# Patient Record
Sex: Female | Born: 1950
Health system: Southern US, Community
[De-identification: ages and names within clinical notes are randomized; demographics above are authoritative.]

## PROBLEM LIST (undated history)

## (undated) DIAGNOSIS — M1990 Unspecified osteoarthritis, unspecified site: Secondary | ICD-10-CM

## (undated) DIAGNOSIS — Z9889 Other specified postprocedural states: Secondary | ICD-10-CM

## (undated) DIAGNOSIS — R011 Cardiac murmur, unspecified: Secondary | ICD-10-CM

## (undated) DIAGNOSIS — I10 Essential (primary) hypertension: Secondary | ICD-10-CM

## (undated) DIAGNOSIS — K219 Gastro-esophageal reflux disease without esophagitis: Secondary | ICD-10-CM

## (undated) DIAGNOSIS — R112 Nausea with vomiting, unspecified: Secondary | ICD-10-CM

## (undated) HISTORY — PX: CERVICAL FUSION: SHX112

---

## 1975-01-16 HISTORY — PX: DILATION AND CURETTAGE OF UTERUS: SHX78

## 2001-02-20 ENCOUNTER — Other Ambulatory Visit: Admission: RE | Admit: 2001-02-20 | Discharge: 2001-02-20 | Payer: Self-pay | Admitting: Obstetrics and Gynecology

## 2002-08-26 ENCOUNTER — Other Ambulatory Visit: Admission: RE | Admit: 2002-08-26 | Discharge: 2002-08-26 | Payer: Self-pay | Admitting: Obstetrics and Gynecology

## 2003-02-26 ENCOUNTER — Ambulatory Visit (HOSPITAL_COMMUNITY): Admission: RE | Admit: 2003-02-26 | Discharge: 2003-02-26 | Payer: Self-pay | Admitting: Internal Medicine

## 2003-11-05 ENCOUNTER — Other Ambulatory Visit: Admission: RE | Admit: 2003-11-05 | Discharge: 2003-11-05 | Payer: Self-pay | Admitting: Obstetrics and Gynecology

## 2004-11-16 ENCOUNTER — Other Ambulatory Visit: Admission: RE | Admit: 2004-11-16 | Discharge: 2004-11-16 | Payer: Self-pay | Admitting: Obstetrics and Gynecology

## 2004-11-23 ENCOUNTER — Ambulatory Visit (HOSPITAL_COMMUNITY): Admission: RE | Admit: 2004-11-23 | Discharge: 2004-11-23 | Payer: Self-pay | Admitting: Internal Medicine

## 2008-01-16 HISTORY — PX: JOINT REPLACEMENT: SHX530

## 2008-07-27 ENCOUNTER — Inpatient Hospital Stay (HOSPITAL_COMMUNITY): Admission: RE | Admit: 2008-07-27 | Discharge: 2008-07-30 | Payer: Self-pay | Admitting: Orthopaedic Surgery

## 2009-02-03 ENCOUNTER — Ambulatory Visit (HOSPITAL_COMMUNITY): Admission: RE | Admit: 2009-02-03 | Discharge: 2009-02-03 | Payer: Self-pay | Admitting: Orthopaedic Surgery

## 2010-04-23 LAB — COMPREHENSIVE METABOLIC PANEL
ALT: 31 U/L (ref 0–35)
AST: 22 U/L (ref 0–37)
Albumin: 4.4 g/dL (ref 3.5–5.2)
Alkaline Phosphatase: 87 U/L (ref 39–117)
BUN: 14 mg/dL (ref 6–23)
CO2: 26 mEq/L (ref 19–32)
Calcium: 9.9 mg/dL (ref 8.4–10.5)
Chloride: 107 mEq/L (ref 96–112)
Creatinine, Ser: 0.72 mg/dL (ref 0.4–1.2)
GFR calc Af Amer: >60 mL/min (ref >60)
GFR calc non Af Amer: >60 mL/min (ref >60)
Glucose, Bld: 95 mg/dL (ref 70–99)
Potassium: 4.4 mEq/L (ref 3.5–5.1)
Sodium: 140 mEq/L (ref 135–145)
Total Bilirubin: 1 mg/dL (ref 0.3–1.2)
Total Protein: 7.1 g/dL (ref 6.0–8.3)

## 2010-04-23 LAB — CROSSMATCH
ABO/RH(D): O POS
Antibody Screen: NEGATIVE

## 2010-04-23 LAB — DIFFERENTIAL
Basophils Absolute: 0 10*3/uL (ref 0.0–0.1)
Basophils Relative: 1 % (ref 0–1)
Eosinophils Absolute: 0.1 10*3/uL (ref 0.0–0.7)
Eosinophils Relative: 2 % (ref 0–5)
Lymphocytes Relative: 32 % (ref 12–46)
Lymphs Abs: 2.1 10*3/uL (ref 0.7–4.0)
Monocytes Absolute: 0.6 10*3/uL (ref 0.1–1.0)
Monocytes Relative: 9 % (ref 3–12)
Neutro Abs: 3.8 10*3/uL (ref 1.7–7.7)
Neutrophils Relative %: 57 % (ref 43–77)

## 2010-04-23 LAB — CBC
HCT: 27.3 % — ABNORMAL LOW (ref 36.0–46.0)
HCT: 22.2 % — ABNORMAL LOW (ref 36.0–46.0)
HCT: 27.4 % — ABNORMAL LOW (ref 36.0–46.0)
HCT: 36.9 % (ref 36.0–46.0)
Hemoglobin: 9.5 g/dL — ABNORMAL LOW (ref 12.0–15.0)
Hemoglobin: 12.6 g/dL (ref 12.0–15.0)
Hemoglobin: 7.7 g/dL — CL (ref 12.0–15.0)
MCHC: 34.8 g/dL (ref 30.0–36.0)
MCHC: 34.1 g/dL (ref 30.0–36.0)
MCHC: 34.6 g/dL (ref 30.0–36.0)
MCV: 88.7 fL (ref 78.0–100.0)
MCV: 88.8 fL (ref 78.0–100.0)
MCV: 92.3 fL (ref 78.0–100.0)
MCV: 92.7 fL (ref 78.0–100.0)
Platelets: 137 10*3/uL — ABNORMAL LOW (ref 150–400)
Platelets: 150 10*3/uL (ref 150–400)
Platelets: 257 10*3/uL (ref 150–400)
RBC: 3.08 MIL/uL — ABNORMAL LOW (ref 3.87–5.11)
RBC: 2.4 MIL/uL — ABNORMAL LOW (ref 3.87–5.11)
RBC: 3.08 MIL/uL — ABNORMAL LOW (ref 3.87–5.11)
RBC: 3.98 MIL/uL (ref 3.87–5.11)
RDW: 15.1 % (ref 11.5–15.5)
RDW: 13.2 % (ref 11.5–15.5)
RDW: 13.3 % (ref 11.5–15.5)
WBC: 8.5 10*3/uL (ref 4.0–10.5)
WBC: 6.7 10*3/uL (ref 4.0–10.5)
WBC: 8.3 10*3/uL (ref 4.0–10.5)
WBC: 8.4 10*3/uL (ref 4.0–10.5)

## 2010-04-23 LAB — PROTIME-INR
INR: 3.1 — ABNORMAL HIGH (ref 0.00–1.49)
INR: 0.9 (ref 0.00–1.49)
INR: 1.2 (ref 0.00–1.49)
Prothrombin Time: 34.5 seconds — ABNORMAL HIGH (ref 11.6–15.2)
Prothrombin Time: 12.4 seconds (ref 11.6–15.2)
Prothrombin Time: 15.4 seconds — ABNORMAL HIGH (ref 11.6–15.2)
Prothrombin Time: 27.3 seconds — ABNORMAL HIGH (ref 11.6–15.2)

## 2010-04-23 LAB — BASIC METABOLIC PANEL
BUN: 7 mg/dL (ref 6–23)
BUN: 10 mg/dL (ref 6–23)
BUN: 6 mg/dL (ref 6–23)
CO2: 32 mEq/L (ref 19–32)
CO2: 29 mEq/L (ref 19–32)
CO2: 32 mEq/L (ref 19–32)
Calcium: 8.1 mg/dL — ABNORMAL LOW (ref 8.4–10.5)
Calcium: 7.8 mg/dL — ABNORMAL LOW (ref 8.4–10.5)
Calcium: 7.9 mg/dL — ABNORMAL LOW (ref 8.4–10.5)
Chloride: 101 mEq/L (ref 96–112)
Chloride: 102 mEq/L (ref 96–112)
Chloride: 103 mEq/L (ref 96–112)
Creatinine, Ser: 0.63 mg/dL (ref 0.4–1.2)
Creatinine, Ser: 0.59 mg/dL (ref 0.4–1.2)
Creatinine, Ser: 0.69 mg/dL (ref 0.4–1.2)
GFR calc Af Amer: >60 mL/min (ref >60)
GFR calc Af Amer: >60 mL/min (ref >60)
GFR calc Af Amer: >60 mL/min (ref >60)
GFR calc non Af Amer: >60 mL/min (ref >60)
GFR calc non Af Amer: >60 mL/min (ref >60)
GFR calc non Af Amer: >60 mL/min (ref >60)
Glucose, Bld: 102 mg/dL — ABNORMAL HIGH (ref 70–99)
Glucose, Bld: 130 mg/dL — ABNORMAL HIGH (ref 70–99)
Glucose, Bld: 141 mg/dL — ABNORMAL HIGH (ref 70–99)
Potassium: 3.6 mEq/L (ref 3.5–5.1)
Potassium: 3.5 mEq/L (ref 3.5–5.1)
Potassium: 3.9 mEq/L (ref 3.5–5.1)
Sodium: 137 mEq/L (ref 135–145)
Sodium: 135 mEq/L (ref 135–145)
Sodium: 137 mEq/L (ref 135–145)

## 2010-04-23 LAB — URINALYSIS, ROUTINE W REFLEX MICROSCOPIC
Bilirubin Urine: NEGATIVE
Bilirubin Urine: NEGATIVE
Glucose, UA: NEGATIVE mg/dL
Glucose, UA: NEGATIVE mg/dL
Ketones, ur: NEGATIVE mg/dL
Ketones, ur: NEGATIVE mg/dL
Leukocytes, UA: NEGATIVE
Leukocytes, UA: NEGATIVE
Nitrite: NEGATIVE
Nitrite: NEGATIVE
Protein, ur: NEGATIVE mg/dL
Protein, ur: NEGATIVE mg/dL
Specific Gravity, Urine: 1.007 (ref 1.005–1.030)
Specific Gravity, Urine: 1.028 (ref 1.005–1.030)
Urobilinogen, UA: 0.2 mg/dL (ref 0.0–1.0)
Urobilinogen, UA: 1 mg/dL (ref 0.0–1.0)
pH: 5.5 (ref 5.0–8.0)
pH: 5.5 (ref 5.0–8.0)

## 2010-04-23 LAB — URINE MICROSCOPIC-ADD ON

## 2010-04-23 LAB — URINE CULTURE
Colony Count: 30000
Colony Count: NO GROWTH
Culture: NO GROWTH

## 2010-04-23 LAB — ABO/RH: ABO/RH(D): O POS

## 2010-04-23 LAB — APTT: aPTT: 29 seconds (ref 24–37)

## 2010-05-30 NOTE — Op Note (Signed)
NAME:  Nichole Brewer, Nichole Brewer           ACCOUNT NO.:  [id number]   MEDICAL RECORD NO.:  [id number]          PATIENT TYPE:  INP   LOCATION:  5018                         FACILITY:  [facility]   PHYSICIAN:  Claude Manges. Whitfield, M.D.DATE OF BIRTH:  May 02, 1950   DATE OF PROCEDURE:  07/27/2008  DATE OF DISCHARGE:                               OPERATIVE REPORT   PREOPERATIVE DIAGNOSIS:  End-stage osteoarthritis, right hip.   POSTOPERATIVE DIAGNOSIS:  End-stage osteoarthritis, right hip.   PROCEDURE:  Right total hip replacement.   SURGEON:  Claude Manges. Cleophas Dunker, MD   ASSISTANT:  Lenard Galloway. Chaney Malling, MD and Oris Drone. Petrarca, PA-C   ANESTHESIA:  General.   COMPLICATIONS:  None.   COMPONENTS:  DePuy AML 52-mm outer diameter sector 2 acetabular metallic  cup, the apex hole eliminator, the pedicle Marathon acetabular liner  Plus 4 with a 10-degree liner.  A 12-mm diameter large stature femoral  component with a 36-mm outer diameter hip ball 12-mm neck length.  I did  use two pedicle cancellus bone screws in the acetabulum, 6.5 mm in  diameter; one was 28 and the other was 25 mm.   PROCEDURE:  The patient comfortable on operating room table and under  general orotracheal anesthesia, nursing staff inserted a Foley catheter.  Urine was clear.   The patient was then placed in the lateral decubitus position with the  right side up and secured to the operating room table with the Innomed  hip system.  The right hip was then prepped with Betadine scrub and then  DuraPrep from the iliac crest to about the midcalf.  Sterile draping was  performed.   A routine southern incision was utilized and via sharp dissection was  carried down to the subcutaneous tissue.  There was abundant adipose  tissue, which made the procedure technically difficult.  There is at  least 3 inches of adipose tissue before I encountered.  The iliotibial  band was identified and incised the length of the skin incision.  Self-  retaining retractors were inserted.   With the hip internally rotated, the short external rotators were  identified.  Tendinous structures were tagged with 0 Ethibond suture and  then incised.  The capsule was identified and incised on the femoral  neck and head.  There was minimal effusion.  I could at that point  easily dislocate the hip posteriorly.  The head was devoid predominantly  of articular cartilage for the large osteophytes.  Using the calcar  guide, the head was osteotomized about a fingerbreadth above the lesser  trochanter.  The head was then delivered from the wound.   A Charnley retractor was inserted around the femoral neck.  I incised  the capsule to visualize the piriformis fossa.  Starter hole was then  made.  Reaming was performed to 11.5-mm to accept a 12-mm component.  Rasping was then performed sequentially to a 12-mm large femoral  component.   Retractor was then placed above the acetabulum.  We sized a 52-mm  acetabular component preoperatively and re-reamed a 51 with a very  shallow acetabulum, so we had to ream on  several occasions to get the  appropriate depth.  Then, we trialed a 52 components and felt that the  52 would seat proudly, but had good rim fit.  Accordingly, we impacted  the 52 mm outer diameter acetabular component the sector 2 in case we  needed to use screws.  We thought initially that we did not have good  purchase and accordingly, we re-reamed to more depths and at that point,  felt that we had good coverage.  We had also the thought that there was  some deformity of the acetabulum where it was actually retroverted with  some lack of posterior acetabular wall.  Thus, the reason for using  acetabular screws.  At that point, we impacted the acetabular component,  felt that it was stable, inserted the trial polyethylene liner, re-  inserted the 12-mm large femoral component and then trialed several neck  length using the 36-mm hip ball.  The  12-mm hip ball gave [place] perfect  stability and we thought leg lengths were at that point symmetrical.  Accordingly, the trial components were removed.  We inserted two screws  in the acetabulum superiorly and felt that we had cortex, we had bone  through the internal length of the drill hole; one measured 25 and the  other 20 mm.  We inserted the screws and had excellent purchase using  the 6.5 screws.  We then copiously irrigated throughout every step with  saline solution.  We inserted the apex hole eliminator followed by the  polyethylene Marathon liner with a 10-degree posterior lip.   We had trialed the femoral component with a Prodigy neck and  accordingly, inserted the Prodigy stem 12 mm large stature and this was  impacted within a millimeter of the femoral neck.  We then again trialed  the 36-mm hip ball with a 12-mm neck length, impacted this, we had  perfect stability throughout a full range of motion.  There was no  toggling.  I thought leg lengths remained the same as they were  preoperatively.   The hip was carefully then dislocated.  We cleaned the [facility] taper neck  and applied the final 36-mm ball with a 12-mm neck length.  The  acetabulum was inspected, was clear of any loose material.  The entire  construct was then reduced and again through a full range of motion  though we had excellent stability.  Wound was again irrigated with  saline solution.  The capsule was closed anatomically with #1 Ethibond.  The tendinous short external rotators were closed with a similar  material.  The wound was again irrigated.  The iliotibial band was  closed with a running 0 Vicryl.  The wound was again irrigated.  The  subcu was closed in multiple layers with 0 and 2-0 Vicryl and then skin  clips.  Sterile bulky dressing was applied.   The patient was awoken and carefully placed supine on the operating room  stretcher, returned to the postanesthesia recovery room.      Claude Manges.  Cleophas Dunker, M.D.  Electronically Signed     PWW/MEDQ  D:  07/27/2008  T:  07/28/2008  Job:  [id number]

## 2010-06-02 NOTE — Discharge Summary (Signed)
NAME:  Nichole, Brewer           ACCOUNT NO.:  [id number]   MEDICAL RECORD NO.:  [id number]          PATIENT TYPE:  INP   LOCATION:  5018                         FACILITY:  [facility]   PHYSICIAN:  Claude Manges. Whitfield, M.D.DATE OF BIRTH:  16-Aug-1950   DATE OF ADMISSION:  07/27/2008  DATE OF DISCHARGE:  07/30/2008                               DISCHARGE SUMMARY   ADMISSION DIAGNOSIS:  Osteoarthritis, right hip.   DISCHARGE DIAGNOSIS:  1. Osteoarthritis, right hip.  2. Posthemorrhagic anemia.   PROCEDURE:  Right total hip arthroplasty, metal-on-polyethylene.   HISTORY:  Nichole Brewer is a 60 year old white divorced female with  pain in her right hip and groin which has progressively worsened to now  moderate to severe.  It is mainly an aching pain with occasional sharp  stabbing pain.  She is not having pain with activities of daily living  as well as at night time.  She has failed conservative treatment.  She  is now indicated for total hip arthroplasty.   HOSPITAL COURSE:  A 60 year old female admitted on July 27, 2008, after  appropriate laboratory studies were obtained as well as 2 grams of Ancef  on-call to the operating room.  She was taken to the operating room  where she underwent a right total hip arthroplasty.  She tolerated the  procedure well.  She was continued on Ancef 1 g IV q.6 h. for three  doses.  Placed on Dilaudid PCA pump regular dose.  Begun on Lovenox 40  mg subcu q.24 h. until her Coumadin became therapeutic.  She is placed  into a knee immobilizer on the right when in bed.  Posterior hip  precautions were started.  Partial weightbearing, 50% of body weight.  PT and OT consults were made.  She did have post hemorrhagic anemia and  was transfused 2 units of packed cells on the 14th.  She was also weaned  off of her PCA and saline locked after her blood was completed.  She was  weaned off of her O2.  A urine for C and S, and routine UA was also  ordered on the  14th.  The remainder of her hospital course was  uneventful.  She was started on Cipro 250 mg p.o. b.i.d. on the 15th,  because it was felt she may have an urinary tract infection.  She was  discharged on the 16th and Cipro was then discontinued at home.   RADIOGRAPHIC STUDIES:  Chest x-ray of July 21, 2008 showed no active  disease, received 2 units of packed cells during her hospital course.  Preop hemoglobin 12.6, hematocrit 36.9%, white count 6700, platelets  257,000.  She dropped to 7.7 hemoglobin on the 14th, two units were  given.  The discharge hemoglobin 9.5, hematocrit 27.3%, white count  8500, platelets 137,000.  Protime preop 12.4, INR 0.9, PTT 29.  Discharge protime 34.5, INR 3.1.  Preop sodium 140, potassium 4.4,  chloride 107, CO2 of 26, glucose 95, BUN 14, creatinine 0.72.  Discharge  sodium 136, potassium 3.6, chloride 101, CO2 of 32, glucose 102, BUN 7,  creatinine 0.63.  GFR was greater  than 60 throughout her hospital  course.  Preop total protein 7.1, albumin 4.4, AST 22, ALT 31, ALP 87  and bilirubin was 1.0.  Urinalysis preop revealed a trace hemoglobin 0-2  red cells.  Urine of July 14 revealed hyaline cast and uric acid  crystals, few epithelials and 3-6 whites and 3-6 reds.  Mucus was  present.  Urine culture of July 7 revealed 30,000 colonies/mL multiple  bacterial morphotypes.  Urine culture July 14 revealed no growth.   DISCHARGE INSTRUCTIONS:  Regular diet.  Increase activity slowly.  Use  her walker, 50% weightbearing as taught in PT.  No lifting or driving  for 6 weeks.  Keep the incision clean and dry and change dressing daily  with sterile 4x4s and tape.  She will be 50% weightbearing as taught in  PT.  Follow the hip precautions.  Prescription for Percocet 5/325 one to  two tabs every 4 hours as needed for pain, Robaxin 500 mg 1 tablet every  6 hours as needed for spasms, Coumadin 5 mg as directed by Home Health  pharmacist.  Colace 100 mg twice a day for  stool softener and MiraLax as  needed for constipation.  She was not to take any Coumadin on the night  of her discharge and then start with a half a tab on the 17th.  [facility] for her home health.  She will follow back up with Dr.  Cleophas Dunker on August 09, 2008.  She was discharged in improved condition.      Oris Drone Petrarca, P.A.-C.      Claude Manges. Cleophas Dunker, M.D.  Electronically Signed    BDP/MEDQ  D:  08/10/2008  T:  08/10/2008  Job:  [id number]

## 2010-06-02 NOTE — Op Note (Signed)
NAME:  Nichole Brewer, Nichole Brewer                     ACCOUNT NO.:  [id number]   MEDICAL RECORD NO.:  [id number]                   PATIENT TYPE:  AMB   LOCATION:  DAY                                  FACILITY:  [facility]   PHYSICIAN:  R. Roetta Sessions, M.D.              DATE OF BIRTH:  [birth year]   DATE OF PROCEDURE:  02/26/2003  DATE OF DISCHARGE:                                 OPERATIVE REPORT   PROCEDURE:  Screening colonoscopy.   INDICATIONS FOR PROCEDURE:  The patient is a 60 year old lady devoid of any  lower GI tract symptoms.  There is no family history of colorectal  neoplasia.  She has never had her lower GI tract imaged.  She comes for  screening colonoscopy.  She is followed primarily by Dr. Ouida Sills.  This  approach has been discussed with the patient along with the potential risks,  benefits, and alternatives.  Her questions were answered.  She is agreeable.  Please see my handwritten H&P for more information.   PROCEDURE:  O2 saturation, blood pressure, pulses, and respirations were  monitored throughout the entirety of the procedure.  Conscious sedation was  with Versed and Demerol in divided doses.  The instrument used was the  Olympus GIF-Q140 colonoscope.   FINDINGS:  Digital rectal examination revealed no abnormalities.   ENDOSCOPIC FINDINGS:  The prep was good.   Rectum:  Examination of the rectal mucosa including retroflex view of the  anal verge revealed no abnormalities.   Colon:  The colonic mucosa was surveyed from the rectosigmoid junction  through the left, transverse, right colon to the area of the appendiceal  orifice, ileocecal valve, and cecum.  These structures were well-seen and  photographed for the record.  From this level, the scope was slowly  withdrawn.  All previously mentioned mucosal surfaces were again seen.  The  colonic mucosa appeared entirely normal.  The terminal ileum was intubated  to 5 cm.  This segment of the GI tract also appeared normal.   The patient  tolerated the procedure well and was reactive in endoscopy.   IMPRESSION:  1. Normal rectum.  2. Normal colon.  3. Normal terminal ileum.   RECOMMENDATIONS:  Repeat colonoscopy in 10 years.      ___________________________________________                                            Jonathon Bellows, M.D.   RMR/MEDQ  D:  02/26/2003  T:  02/26/2003  Job:  [id number]   cc:   Kingsley Callander. Ouida Sills, M.D.  [address]  [place]  [place] [address]  Fax: [phone]

## 2010-10-07 IMAGING — CR DG CHEST 2V
2 series · 2 of 2 positions shown · non-contrast
Comparison: None

CLINICAL DATA: Preoperative respiratory exam for orthopedic
surgery.

CHEST - 2 VIEW

[view not recorded (1 of 2)]
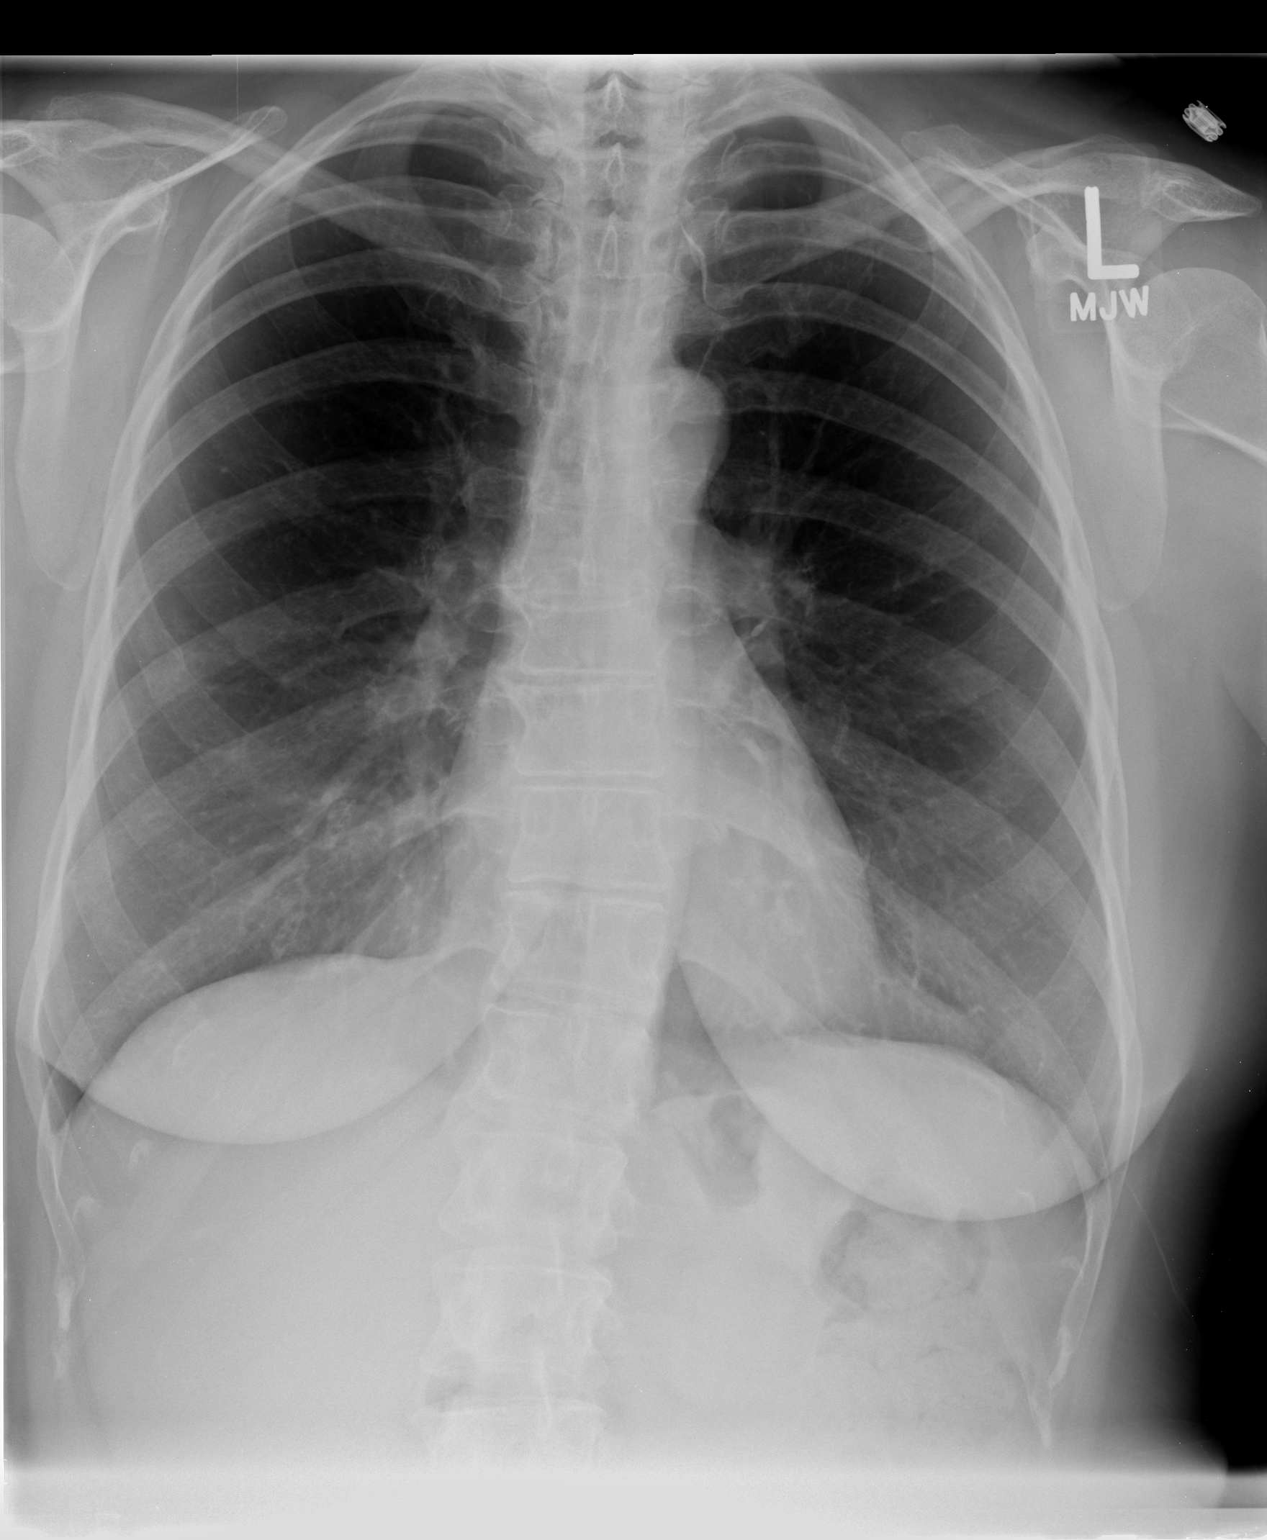

[view not recorded (2 of 2)]
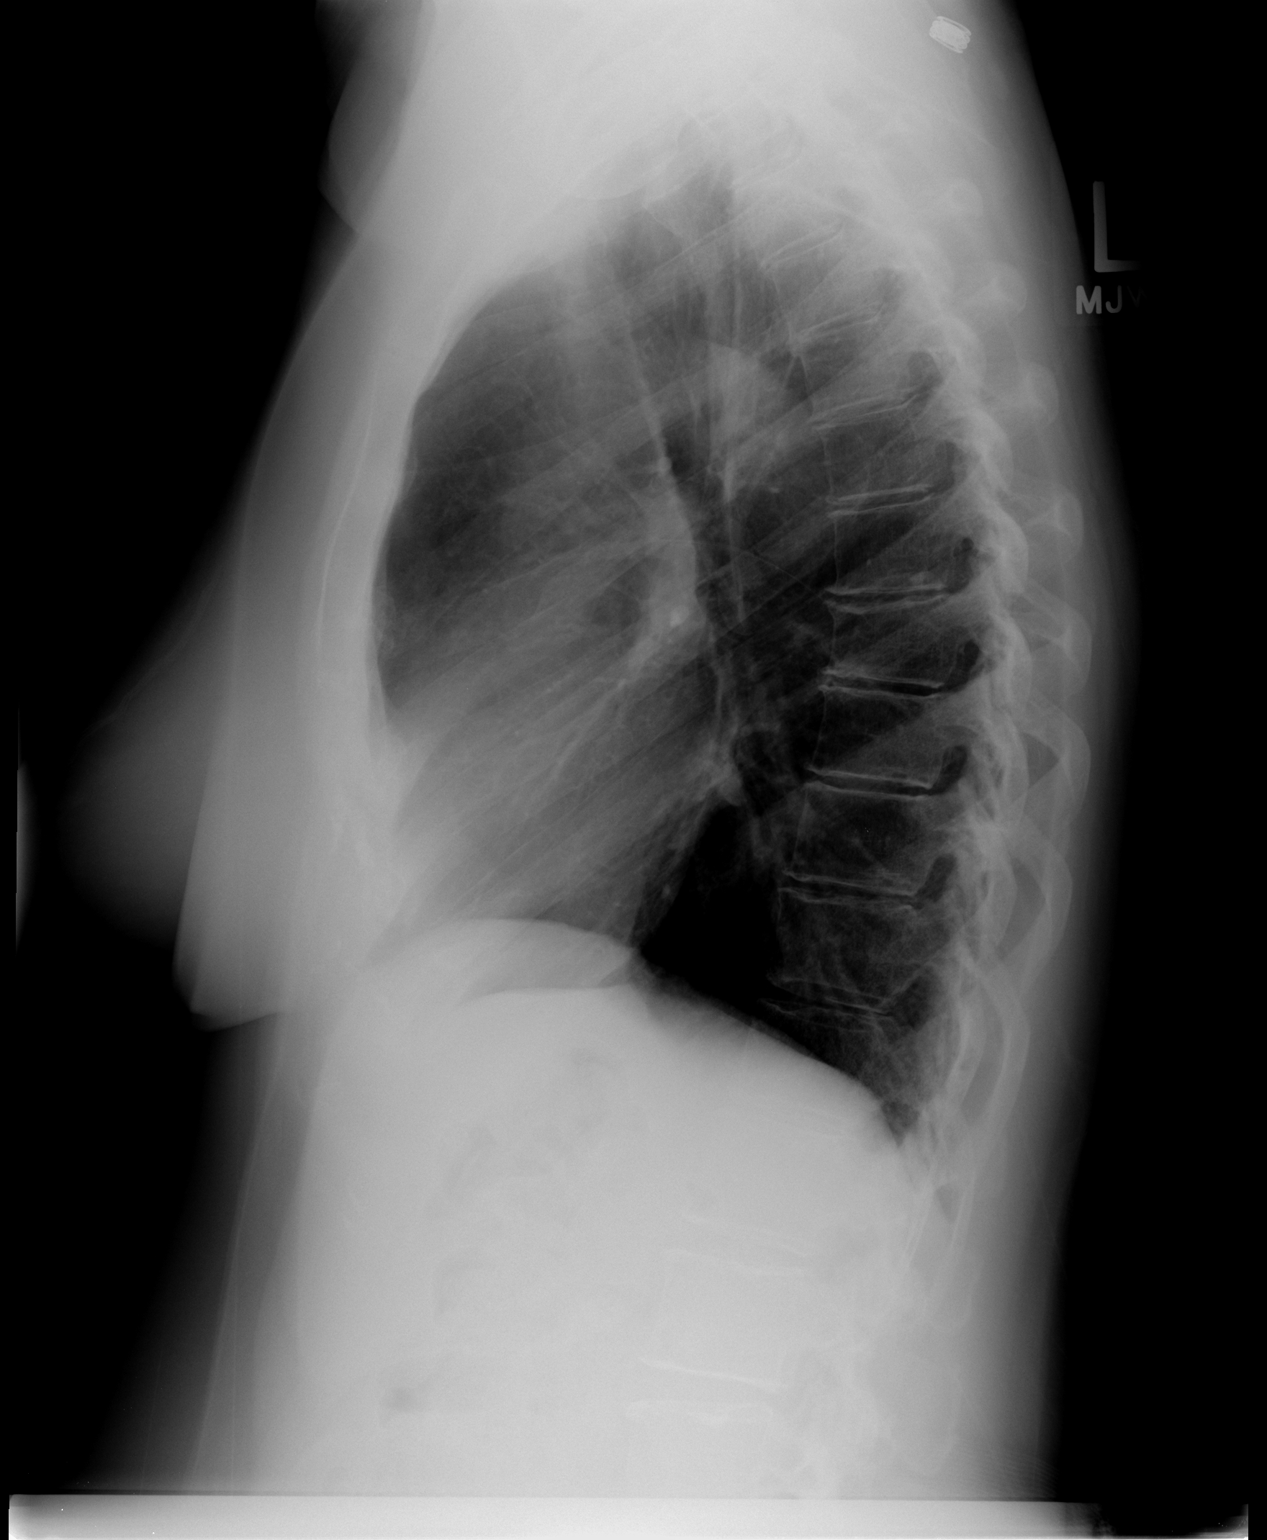

[2 of 2 positions shown; findings below may reference images not displayed]

FINDINGS: Heart size is normal.  The mediastinum is unremarkable.
Lungs are clear.  No effusions.  Ordinary degenerative change
effects the spine.
IMPRESSION: No active disease

## 2012-09-24 ENCOUNTER — Other Ambulatory Visit (HOSPITAL_COMMUNITY): Payer: Self-pay | Admitting: Obstetrics and Gynecology

## 2012-09-24 DIAGNOSIS — N951 Menopausal and female climacteric states: Secondary | ICD-10-CM

## 2012-09-24 DIAGNOSIS — Z780 Asymptomatic menopausal state: Secondary | ICD-10-CM

## 2013-03-19 ENCOUNTER — Encounter (HOSPITAL_COMMUNITY): Payer: Self-pay | Admitting: Pharmacy Technician

## 2013-04-02 NOTE — Patient Instructions (Signed)
Vaida Kerchner Schexnider  04/02/2013   Your procedure is scheduled on:  04/06/13  Report to [facility] at 1300 AM.  Call this number if you have problems the morning of surgery: [phone]   Remember:   Do not eat food or drink liquids after midnight.   Take these medicines the morning of surgery with A SIP OF WATER: none   Do not wear jewelry, make-up or nail polish.  Do not wear lotions, powders, or perfumes. You may wear deodorant.  Do not shave 48 hours prior to surgery. Men may shave face and neck.  Do not bring valuables to the hospital.  [facility] is not responsible                  for any belongings or valuables.               Contacts, dentures or bridgework may not be worn into surgery.  Leave suitcase in the car. After surgery it may be brought to your room.  For patients admitted to the hospital, discharge time is determined by your                treatment team.               Patients discharged the day of surgery will not be allowed to drive  home.  Name and phone number of your driver: family  Special Instructions: N/A   Please read over the following fact sheets that you were given: Anesthesia Post-op Instructions and Care and Recovery After Surgery   PATIENT INSTRUCTIONS POST-ANESTHESIA  IMMEDIATELY FOLLOWING SURGERY:  Do not drive or operate machinery for the first twenty four hours after surgery.  Do not make any important decisions for twenty four hours after surgery or while taking narcotic pain medications or sedatives.  If you develop intractable nausea and vomiting or a severe headache please notify your doctor immediately.  FOLLOW-UP:  Please make an appointment with your surgeon as instructed. You do not need to follow up with anesthesia unless specifically instructed to do so.  WOUND CARE INSTRUCTIONS (if applicable):  Keep a dry clean dressing on the anesthesia/puncture wound site if there is drainage.  Once the wound has quit draining you may leave it  open to air.  Generally you should leave the bandage intact for twenty four hours unless there is drainage.  If the epidural site drains for more than 36-48 hours please call the anesthesia department.  QUESTIONS?:  Please feel free to call your physician or the hospital operator if you have any questions, and they will be happy to assist you.      Cataract Surgery  A cataract is a clouding of the lens of the eye. When a lens becomes cloudy, vision is reduced based on the degree and nature of the clouding. Surgery may be needed to improve vision. Surgery removes the cloudy lens and usually replaces it with a substitute lens (intraocular lens, IOL). LET YOUR EYE DOCTOR KNOW ABOUT:  Allergies to food or medicine.  Medicines taken including herbs, eyedrops, over-the-counter medicines, and creams.  Use of steroids (by mouth or creams).  Previous problems with anesthetics or numbing medicine.  History of bleeding problems or blood clots.  Previous surgery.  Other health problems, including diabetes and kidney problems.  Possibility of pregnancy, if this applies. RISKS AND COMPLICATIONS  Infection.  Inflammation of the eyeball (endophthalmitis) that can spread to both eyes (sympathetic ophthalmia).  Poor wound healing.  If an IOL is inserted, it can later fall out of proper position. This is very uncommon.  Clouding of the part of your eye that holds an IOL in place. This is called an "after-cataract." These are uncommon, but easily treated. BEFORE THE PROCEDURE  Do not eat or drink anything except small amounts of water for 8 to 12 before your surgery, or as directed by your caregiver.  Unless you are told otherwise, continue any eyedrops you have been prescribed.  Talk to your primary caregiver about all other medicines that you take (both prescription and non-prescription). In some cases, you may need to stop or change medicines near the time of your surgery. This is most  important if you are taking blood-thinning medicine.Do not stop medicines unless you are told to do so.  Arrange for someone to drive you to and from the procedure.  Do not put contact lenses in either eye on the day of your surgery. PROCEDURE There is more than one method for safely removing a cataract. Your doctor can explain the differences and help determine which is best for you. Phacoemulsification surgery is the most common form of cataract surgery.  An injection is given behind the eye or eyedrops are given to make this a painless procedure.  A small cut (incision) is made on the edge of the clear, dome-shaped surface that covers the front of the eye (cornea).  A tiny probe is painlessly inserted into the eye. This device gives off ultrasound waves that soften and break up the cloudy center of the lens. This makes it easier for the cloudy lens to be removed by suction.  An IOL may be implanted.  The normal lens of the eye is covered by a clear capsule. Part of that capsule is intentionally left in the eye to support the IOL.  Your surgeon may or may not use stitches to close the incision. There are other forms of cataract surgery that require a larger incision and stiches to close the eye. This approach is taken in cases where the doctor feels that the cataract cannot be easily removed using phacoemulsification. AFTER THE PROCEDURE  When an IOL is implanted, it does not need care. It becomes a permanent part of your eye and cannot be seen or felt.  Your doctor will schedule follow-up exams to check on your progress.  Review your other medicines with your doctor to see which can be resumed after surgery.  Use eyedrops or take medicine as prescribed by your doctor. Document Released: 12/21/2010 Document Revised: 03/26/2011 Document Reviewed: 12/21/2010 [facility] Patient Information 2014 [place], [place].

## 2013-04-03 ENCOUNTER — Encounter (HOSPITAL_COMMUNITY)
Admission: RE | Admit: 2013-04-03 | Discharge: 2013-04-03 | Disposition: A | Discharge status: Home / Self Care | Payer: BC Managed Care – PPO | Source: Ambulatory Visit | Attending: Ophthalmology | Admitting: Ophthalmology

## 2013-04-03 ENCOUNTER — Encounter (HOSPITAL_COMMUNITY): Payer: Self-pay

## 2013-04-03 ENCOUNTER — Other Ambulatory Visit: Payer: Self-pay

## 2013-04-03 HISTORY — DX: Unspecified osteoarthritis, unspecified site: M19.90

## 2013-04-03 HISTORY — DX: Other specified postprocedural states: R11.2

## 2013-04-03 HISTORY — DX: Other specified postprocedural states: Z98.890

## 2013-04-03 LAB — BASIC METABOLIC PANEL
BUN: 15 mg/dL (ref 6–23)
CHLORIDE: 105 meq/L (ref 96–112)
CO2: 27 mEq/L (ref 19–32)
CREATININE: 0.69 mg/dL (ref 0.50–1.10)
Calcium: 9.2 mg/dL (ref 8.4–10.5)
GFR calc Af Amer: >90 mL/min (ref >90)
GLUCOSE: 108 mg/dL — AB (ref 70–99)
POTASSIUM: 3.9 meq/L (ref 3.7–5.3)
Sodium: 142 mEq/L (ref 137–147)

## 2013-04-03 LAB — HEMOGLOBIN AND HEMATOCRIT, BLOOD
HCT: 36.4 % (ref 36.0–46.0)
Hemoglobin: 12 g/dL (ref 12.0–15.0)

## 2013-04-03 MED ORDER — CYCLOPENTOLATE-PHENYLEPHRINE OP SOLN OPTIME - NO CHARGE
OPHTHALMIC | Status: AC
  Filled 2013-04-03: qty 2

## 2013-04-03 MED ORDER — LIDOCAINE HCL 3.5 % OP GEL
OPHTHALMIC | Status: AC
  Filled 2013-04-03: qty 1

## 2013-04-03 MED ORDER — TETRACAINE HCL 0.5 % OP SOLN
OPHTHALMIC | Status: AC
  Filled 2013-04-03: qty 2

## 2013-04-03 MED ORDER — NEOMYCIN-POLYMYXIN-DEXAMETH 3.5-10000-0.1 OP SUSP
OPHTHALMIC | Status: AC
  Filled 2013-04-03: qty 5

## 2013-04-03 MED ORDER — PHENYLEPHRINE HCL 2.5 % OP SOLN
OPHTHALMIC | Status: AC
  Filled 2013-04-03: qty 15

## 2013-04-03 MED ORDER — LIDOCAINE HCL (PF) 1 % IJ SOLN
INTRAMUSCULAR | Status: AC
  Filled 2013-04-03: qty 2

## 2013-04-06 ENCOUNTER — Encounter (HOSPITAL_COMMUNITY): Payer: BC Managed Care – PPO | Admitting: Anesthesiology

## 2013-04-06 ENCOUNTER — Encounter (HOSPITAL_COMMUNITY)
Admission: RE | Disposition: A | Discharge status: Home / Self Care | Payer: Self-pay | Source: Ambulatory Visit | Attending: Ophthalmology

## 2013-04-06 ENCOUNTER — Ambulatory Visit (HOSPITAL_COMMUNITY): Payer: BC Managed Care – PPO | Admitting: Anesthesiology

## 2013-04-06 ENCOUNTER — Encounter (HOSPITAL_COMMUNITY): Payer: Self-pay | Admitting: *Deleted

## 2013-04-06 ENCOUNTER — Ambulatory Visit (HOSPITAL_COMMUNITY)
Admission: RE | Admit: 2013-04-06 | Discharge: 2013-04-06 | Disposition: A | Discharge status: Home / Self Care | Payer: BC Managed Care – PPO | Source: Ambulatory Visit | Attending: Ophthalmology | Admitting: Ophthalmology

## 2013-04-06 DIAGNOSIS — H52229 Regular astigmatism, unspecified eye: Secondary | ICD-10-CM | POA: Insufficient documentation

## 2013-04-06 DIAGNOSIS — H2510 Age-related nuclear cataract, unspecified eye: Secondary | ICD-10-CM | POA: Insufficient documentation

## 2013-04-06 HISTORY — PX: CATARACT EXTRACTION W/PHACO: SHX586

## 2013-04-06 SURGERY — PHACOEMULSIFICATION, CATARACT, WITH IOL INSERTION
Anesthesia: Monitor Anesthesia Care | Site: Eye | Laterality: Left

## 2013-04-06 MED ORDER — ONDANSETRON HCL 4 MG/2ML IJ SOLN: 4.0000 mg | Freq: Once | INTRAMUSCULAR | Status: DC | PRN

## 2013-04-06 MED ORDER — BSS IO SOLN
INTRAOCULAR | Status: DC | PRN
  Administered 2013-04-06: 15 mL via INTRAOCULAR

## 2013-04-06 MED ORDER — CYCLOPENTOLATE-PHENYLEPHRINE 0.2-1 % OP SOLN
1.0000 [drp] | OPHTHALMIC | Status: AC
  Administered 2013-04-06 (×3): 1 [drp] via OPHTHALMIC

## 2013-04-06 MED ORDER — LIDOCAINE HCL 3.5 % OP GEL: 1.0000 "application " | Freq: Once | OPHTHALMIC | Status: DC

## 2013-04-06 MED ORDER — EPINEPHRINE HCL 1 MG/ML IJ SOLN
INTRAMUSCULAR | Status: AC
  Filled 2013-04-06: qty 1

## 2013-04-06 MED ORDER — ONDANSETRON HCL 4 MG/2ML IJ SOLN
4.0000 mg | Freq: Once | INTRAMUSCULAR | Status: AC
  Administered 2013-04-06: 4 mg via INTRAVENOUS

## 2013-04-06 MED ORDER — FENTANYL CITRATE 0.05 MG/ML IJ SOLN
INTRAMUSCULAR | Status: AC
  Filled 2013-04-06: qty 2

## 2013-04-06 MED ORDER — FENTANYL CITRATE 0.05 MG/ML IJ SOLN
25.0000 ug | INTRAMUSCULAR | Status: AC
  Administered 2013-04-06 (×2): 25 ug via INTRAVENOUS

## 2013-04-06 MED ORDER — PROVISC 10 MG/ML IO SOLN
INTRAOCULAR | Status: DC | PRN
  Administered 2013-04-06: 0.85 mL via INTRAOCULAR

## 2013-04-06 MED ORDER — EPINEPHRINE HCL 1 MG/ML IJ SOLN
INTRAOCULAR | Status: DC | PRN
  Administered 2013-04-06: 14:00:00

## 2013-04-06 MED ORDER — POVIDONE-IODINE 5 % OP SOLN
OPHTHALMIC | Status: DC | PRN
  Administered 2013-04-06: 1 via OPHTHALMIC

## 2013-04-06 MED ORDER — TETRACAINE HCL 0.5 % OP SOLN
1.0000 [drp] | OPHTHALMIC | Status: AC
  Administered 2013-04-06 (×3): 1 [drp] via OPHTHALMIC

## 2013-04-06 MED ORDER — PHENYLEPHRINE HCL 2.5 % OP SOLN
1.0000 [drp] | OPHTHALMIC | Status: AC
  Administered 2013-04-06 (×3): 1 [drp] via OPHTHALMIC

## 2013-04-06 MED ORDER — MIDAZOLAM HCL 5 MG/5ML IJ SOLN
INTRAMUSCULAR | Status: AC
  Filled 2013-04-06: qty 5

## 2013-04-06 MED ORDER — LIDOCAINE HCL (PF) 1 % IJ SOLN
INTRAMUSCULAR | Status: DC | PRN
  Administered 2013-04-06: .4 mL

## 2013-04-06 MED ORDER — LIDOCAINE 3.5 % OP GEL OPTIME - NO CHARGE
OPHTHALMIC | Status: DC | PRN
  Administered 2013-04-06: 2 [drp] via OPHTHALMIC

## 2013-04-06 MED ORDER — ONDANSETRON HCL 4 MG/2ML IJ SOLN
INTRAMUSCULAR | Status: AC
  Filled 2013-04-06: qty 2

## 2013-04-06 MED ORDER — MIDAZOLAM HCL 2 MG/2ML IJ SOLN
1.0000 mg | INTRAMUSCULAR | Status: DC | PRN
  Administered 2013-04-06: 2 mg via INTRAVENOUS

## 2013-04-06 MED ORDER — FENTANYL CITRATE 0.05 MG/ML IJ SOLN: 25.0000 ug | INTRAMUSCULAR | Status: DC | PRN

## 2013-04-06 MED ORDER — NEOMYCIN-POLYMYXIN-DEXAMETH 3.5-10000-0.1 OP SUSP
OPHTHALMIC | Status: DC | PRN
  Administered 2013-04-06: 2 [drp] via OPHTHALMIC

## 2013-04-06 MED ORDER — LACTATED RINGERS IV SOLN
INTRAVENOUS | Status: DC
  Administered 2013-04-06: 14:00:00 via INTRAVENOUS

## 2013-04-06 SURGICAL SUPPLY — 33 items
CAPSULAR TENSION RING-AMO (OPHTHALMIC RELATED) IMPLANT
CLOTH BEACON ORANGE TIMEOUT ST (SAFETY) ×2 IMPLANT
EYE SHIELD UNIVERSAL CLEAR (GAUZE/BANDAGES/DRESSINGS) ×2 IMPLANT
GLOVE BIO SURGEON STRL SZ 6.5 (GLOVE) IMPLANT
GLOVE BIOGEL PI IND STRL 6.5 (GLOVE) IMPLANT
GLOVE BIOGEL PI IND STRL 7.0 (GLOVE) IMPLANT
GLOVE BIOGEL PI IND STRL 7.5 (GLOVE) IMPLANT
GLOVE BIOGEL PI INDICATOR 6.5 (GLOVE)
GLOVE BIOGEL PI INDICATOR 7.0 (GLOVE)
GLOVE BIOGEL PI INDICATOR 7.5 (GLOVE)
GLOVE ECLIPSE 6.5 STRL STRAW (GLOVE) IMPLANT
GLOVE ECLIPSE 7.0 STRL STRAW (GLOVE) ×2 IMPLANT
GLOVE ECLIPSE 7.5 STRL STRAW (GLOVE) IMPLANT
GLOVE EXAM NITRILE LRG STRL (GLOVE) IMPLANT
GLOVE EXAM NITRILE MD LF STRL (GLOVE) IMPLANT
GLOVE SKINSENSE NS SZ6.5 (GLOVE)
GLOVE SKINSENSE NS SZ7.0 (GLOVE)
GLOVE SKINSENSE STRL SZ6.5 (GLOVE) IMPLANT
GLOVE SKINSENSE STRL SZ7.0 (GLOVE) IMPLANT
GLOVE SS N UNI LF 7.0 STRL (GLOVE) ×2 IMPLANT
KIT VITRECTOMY (OPHTHALMIC RELATED) IMPLANT
LENS IOL ACRYSOF IQ TORIC 15.0 ×2 IMPLANT
PAD ARMBOARD 7.5X6 YLW CONV (MISCELLANEOUS) ×2 IMPLANT
PROC W NO LENS (INTRAOCULAR LENS)
PROC W SPEC LENS (INTRAOCULAR LENS) ×2
PROCESS W NO LENS (INTRAOCULAR LENS) IMPLANT
PROCESS W SPEC LENS (INTRAOCULAR LENS) ×1 IMPLANT
RING MALYGIN (MISCELLANEOUS) IMPLANT
SYR TB 1ML LL NO SAFETY (SYRINGE) ×2 IMPLANT
TAPE SURG TRANSPORE 1 IN (GAUZE/BANDAGES/DRESSINGS) ×1 IMPLANT
TAPE SURGICAL TRANSPORE 1 IN (GAUZE/BANDAGES/DRESSINGS) ×1
VISCOELASTIC ADDITIONAL (OPHTHALMIC RELATED) IMPLANT
WATER STERILE IRR 250ML POUR (IV SOLUTION) ×2 IMPLANT

## 2013-04-06 NOTE — Anesthesia Preprocedure Evaluation (Signed)
Anesthesia Evaluation  Patient identified by MRN, date of birth, ID band Patient awake    Reviewed: Allergy & Precautions, H&P , NPO status , Patient's Chart, lab work & pertinent test results  History of Anesthesia Complications (+) PONV and history of anesthetic complications  Airway Mallampati: II TM Distance: >3 FB Neck ROM: Full    Dental  (+) Teeth Intact   Pulmonary neg pulmonary ROS,  breath sounds clear to auscultation        Cardiovascular negative cardio ROS  Rhythm:Regular Rate:Normal     Neuro/Psych    GI/Hepatic negative GI ROS,   Endo/Other    Renal/GU      Musculoskeletal   Abdominal   Peds  Hematology   Anesthesia Other Findings   Reproductive/Obstetrics                           Anesthesia Physical Anesthesia Plan  ASA: I  Anesthesia Plan: MAC   Post-op Pain Management:    Induction: Intravenous  Airway Management Planned: Nasal Cannula  Additional Equipment:   Intra-op Plan:   Post-operative Plan:   Informed Consent: I have reviewed the patients History and Physical, chart, labs and discussed the procedure including the risks, benefits and alternatives for the proposed anesthesia with the patient or authorized representative who has indicated his/her understanding and acceptance.     Plan Discussed with:   Anesthesia Plan Comments:         Anesthesia Quick Evaluation  

## 2013-04-06 NOTE — Transfer of Care (Signed)
Immediate Anesthesia Transfer of Care Note  Patient: Nichole Brewer  Procedure(s) Performed: Procedure(s) with comments: CATARACT EXTRACTION PHACO AND INTRAOCULAR LENS PLACEMENT (IOC) (Left) - CDE 13.72  Patient Location: Short Stay  Anesthesia Type:MAC  Level of Consciousness: awake  Airway & Oxygen Therapy: Patient Spontanous Breathing  Post-op Assessment: Report given to PACU RN  Post vital signs: Reviewed  Complications: No apparent anesthesia complications

## 2013-04-06 NOTE — Anesthesia Postprocedure Evaluation (Signed)
  Anesthesia Post-op Note  Patient: Nichole Brewer  Procedure(s) Performed: Procedure(s) with comments: CATARACT EXTRACTION PHACO AND INTRAOCULAR LENS PLACEMENT (IOC) (Left) - CDE 13.72  Patient Location: Short Stay  Anesthesia Type:MAC  Level of Consciousness: awake, alert  and oriented  Airway and Oxygen Therapy: Patient Spontanous Breathing and Patient connected to face mask oxygen  Post-op Pain: none  Post-op Assessment: Post-op Vital signs reviewed, Patient's Cardiovascular Status Stable, Respiratory Function Stable, Patent Airway and No signs of Nausea or vomiting  Post-op Vital Signs: Reviewed and stable  Complications: No apparent anesthesia complications

## 2013-04-06 NOTE — Op Note (Signed)
Date of Admission: 04/06/2013  Date of Surgery: 04/06/2013  Pre-Op Dx: Cataract Left Eye  Post-Op Dx: Nuclear Cataract Left Eye,  Dx Code 366.16, Astigmatism Left Eye, Dx Code 371.69  Surgeon: Tonny Branch, M.D.  Assistants: None  Anesthesia: Topical with MAC  Indications: Painless, progressive loss of vision with compromise of daily activities.  Surgery: Cataract Extraction with Intraocular lens Implant Left Eye  Discription: The patient had dilating drops and viscous lidocaine placed into the Left in the pre-op holding area. In the sitting position horizontal reference marks were made on the cornea.  After transfer to the operating room, a time out was performed. The patient was then prepped and draped. Beginning with a 75 degree blade a paracentesis port was made at the surgeon's 2 o'clock position. The anterior chamber was then filled with 1% non-preserved lidocaine. This was followed by filling the anterior chamber with Provisc. A 2.82mm keratome blade was used to make a clear cornea incision at the temporal limbus. A bent cystatome needle was used to create a continuous tear capsulotomy. Hydrodissection was performed with balanced salt solution on a Fine canula. The lens nucleus was then removed using the phacoemulsification handpiece. Residual cortex was removed with the I&A handpiece. The anterior chamber and capsular bag were refilled with Provisc. A posterior chamber intraocular lens was placed into the capsular bag with it's injector. Additional corneal marks were made on the 100/280 degree meridians.  The Provisc was then removed from the anterior chamber and capsular bag with the I&A handpiece. The implant was positioned with the Kuglan hook. Stromal hydration of the main incision and paracentesis port was performed with BSS on a Fine canula. The wounds were tested for leak which was negative. The patient tolerated the procedure well. There were no operative complications. The patient was  then transferred to the recovery room in stable condition.  Complications: None  Specimen: None  EBL: None  Prosthetic device: Alcon AcrySof Toric, [id number], power 16.0SE, [place] [id number].

## 2013-04-06 NOTE — H&P (Signed)
I have reviewed the H&P, the patient was re-examined, and I have identified no interval changes in medical condition and plan of care since the history and physical of record  

## 2013-04-07 ENCOUNTER — Encounter (HOSPITAL_COMMUNITY): Payer: Self-pay | Admitting: Ophthalmology

## 2013-04-14 ENCOUNTER — Encounter (HOSPITAL_COMMUNITY)
Admission: RE | Admit: 2013-04-14 | Discharge: 2013-04-14 | Disposition: A | Discharge status: Home / Self Care | Payer: BC Managed Care – PPO | Source: Ambulatory Visit | Attending: Ophthalmology | Admitting: Ophthalmology

## 2013-04-14 ENCOUNTER — Encounter (HOSPITAL_COMMUNITY): Payer: Self-pay

## 2013-04-15 MED ORDER — CYCLOPENTOLATE-PHENYLEPHRINE OP SOLN OPTIME - NO CHARGE
OPHTHALMIC | Status: AC
  Filled 2013-04-15: qty 2

## 2013-04-15 MED ORDER — LIDOCAINE HCL 3.5 % OP GEL
OPHTHALMIC | Status: AC
  Filled 2013-04-15: qty 1

## 2013-04-15 MED ORDER — NEOMYCIN-POLYMYXIN-DEXAMETH 3.5-10000-0.1 OP SUSP
OPHTHALMIC | Status: AC
  Filled 2013-04-15: qty 5

## 2013-04-15 MED ORDER — LIDOCAINE HCL (PF) 1 % IJ SOLN
INTRAMUSCULAR | Status: AC
  Filled 2013-04-15: qty 2

## 2013-04-15 MED ORDER — TETRACAINE HCL 0.5 % OP SOLN
OPHTHALMIC | Status: AC
  Filled 2013-04-15: qty 2

## 2013-04-15 MED ORDER — PHENYLEPHRINE HCL 2.5 % OP SOLN
OPHTHALMIC | Status: AC
  Filled 2013-04-15: qty 15

## 2013-04-16 ENCOUNTER — Ambulatory Visit (HOSPITAL_COMMUNITY): Payer: BC Managed Care – PPO | Admitting: Anesthesiology

## 2013-04-16 ENCOUNTER — Encounter (HOSPITAL_COMMUNITY): Payer: BC Managed Care – PPO | Admitting: Anesthesiology

## 2013-04-16 ENCOUNTER — Encounter (HOSPITAL_COMMUNITY): Payer: Self-pay

## 2013-04-16 ENCOUNTER — Encounter (HOSPITAL_COMMUNITY)
Admission: RE | Disposition: A | Discharge status: Home / Self Care | Payer: Self-pay | Source: Ambulatory Visit | Attending: Ophthalmology

## 2013-04-16 ENCOUNTER — Ambulatory Visit (HOSPITAL_COMMUNITY)
Admission: RE | Admit: 2013-04-16 | Discharge: 2013-04-16 | Disposition: A | Discharge status: Home / Self Care | Payer: BC Managed Care – PPO | Source: Ambulatory Visit | Attending: Ophthalmology | Admitting: Ophthalmology

## 2013-04-16 DIAGNOSIS — H52229 Regular astigmatism, unspecified eye: Secondary | ICD-10-CM | POA: Insufficient documentation

## 2013-04-16 DIAGNOSIS — H2510 Age-related nuclear cataract, unspecified eye: Secondary | ICD-10-CM | POA: Insufficient documentation

## 2013-04-16 HISTORY — PX: CATARACT EXTRACTION W/PHACO: SHX586

## 2013-04-16 SURGERY — PHACOEMULSIFICATION, CATARACT, WITH IOL INSERTION
Anesthesia: Monitor Anesthesia Care | Site: Eye | Laterality: Right

## 2013-04-16 MED ORDER — LACTATED RINGERS IV SOLN
INTRAVENOUS | Status: DC
  Administered 2013-04-16: 09:00:00 via INTRAVENOUS

## 2013-04-16 MED ORDER — FENTANYL CITRATE 0.05 MG/ML IJ SOLN
INTRAMUSCULAR | Status: AC
  Filled 2013-04-16: qty 2

## 2013-04-16 MED ORDER — CYCLOPENTOLATE-PHENYLEPHRINE 0.2-1 % OP SOLN
1.0000 [drp] | OPHTHALMIC | Status: AC
  Administered 2013-04-16 (×3): 1 [drp] via OPHTHALMIC

## 2013-04-16 MED ORDER — PROVISC 10 MG/ML IO SOLN
INTRAOCULAR | Status: DC | PRN
  Administered 2013-04-16: 0.85 mL via INTRAOCULAR

## 2013-04-16 MED ORDER — TETRACAINE HCL 0.5 % OP SOLN
1.0000 [drp] | OPHTHALMIC | Status: AC
  Administered 2013-04-16 (×3): 1 [drp] via OPHTHALMIC

## 2013-04-16 MED ORDER — FENTANYL CITRATE 0.05 MG/ML IJ SOLN
25.0000 ug | INTRAMUSCULAR | Status: AC
  Administered 2013-04-16 (×2): 25 ug via INTRAVENOUS

## 2013-04-16 MED ORDER — LIDOCAINE 3.5 % OP GEL OPTIME - NO CHARGE
OPHTHALMIC | Status: DC | PRN
  Administered 2013-04-16: 1 [drp] via OPHTHALMIC

## 2013-04-16 MED ORDER — POVIDONE-IODINE 5 % OP SOLN
OPHTHALMIC | Status: DC | PRN
  Administered 2013-04-16: 1 via OPHTHALMIC

## 2013-04-16 MED ORDER — BSS IO SOLN
INTRAOCULAR | Status: DC | PRN
  Administered 2013-04-16: 15 mL via INTRAOCULAR

## 2013-04-16 MED ORDER — MIDAZOLAM HCL 2 MG/2ML IJ SOLN
1.0000 mg | INTRAMUSCULAR | Status: DC | PRN
  Administered 2013-04-16 (×2): 2 mg via INTRAVENOUS

## 2013-04-16 MED ORDER — NEOMYCIN-POLYMYXIN-DEXAMETH 3.5-10000-0.1 OP SUSP
OPHTHALMIC | Status: DC | PRN
  Administered 2013-04-16: 1 [drp] via OPHTHALMIC

## 2013-04-16 MED ORDER — EPINEPHRINE HCL 1 MG/ML IJ SOLN
INTRAMUSCULAR | Status: AC
  Filled 2013-04-16: qty 1

## 2013-04-16 MED ORDER — PHENYLEPHRINE HCL 2.5 % OP SOLN
1.0000 [drp] | OPHTHALMIC | Status: AC
  Administered 2013-04-16 (×3): 1 [drp] via OPHTHALMIC

## 2013-04-16 MED ORDER — LIDOCAINE HCL 3.5 % OP GEL: 1.0000 "application " | Freq: Once | OPHTHALMIC | Status: DC

## 2013-04-16 MED ORDER — LIDOCAINE HCL (PF) 1 % IJ SOLN
INTRAMUSCULAR | Status: DC | PRN
  Administered 2013-04-16: .3 mL

## 2013-04-16 MED ORDER — MIDAZOLAM HCL 2 MG/2ML IJ SOLN
INTRAMUSCULAR | Status: AC
  Filled 2013-04-16: qty 2

## 2013-04-16 MED ORDER — BSS IO SOLN
INTRAOCULAR | Status: DC | PRN
  Administered 2013-04-16: 10:00:00

## 2013-04-16 SURGICAL SUPPLY — 32 items
CAPSULAR TENSION RING-AMO (OPHTHALMIC RELATED) IMPLANT
CLOTH BEACON ORANGE TIMEOUT ST (SAFETY) ×2 IMPLANT
EYE SHIELD UNIVERSAL CLEAR (GAUZE/BANDAGES/DRESSINGS) ×2 IMPLANT
GLOVE BIO SURGEON STRL SZ 6.5 (GLOVE) IMPLANT
GLOVE BIOGEL PI IND STRL 6.5 (GLOVE) ×1 IMPLANT
GLOVE BIOGEL PI IND STRL 7.0 (GLOVE) IMPLANT
GLOVE BIOGEL PI IND STRL 7.5 (GLOVE) IMPLANT
GLOVE BIOGEL PI INDICATOR 6.5 (GLOVE) ×1
GLOVE BIOGEL PI INDICATOR 7.0 (GLOVE)
GLOVE BIOGEL PI INDICATOR 7.5 (GLOVE)
GLOVE ECLIPSE 6.5 STRL STRAW (GLOVE) IMPLANT
GLOVE ECLIPSE 7.0 STRL STRAW (GLOVE) IMPLANT
GLOVE ECLIPSE 7.5 STRL STRAW (GLOVE) IMPLANT
GLOVE EXAM NITRILE LRG STRL (GLOVE) IMPLANT
GLOVE EXAM NITRILE MD LF STRL (GLOVE) ×2 IMPLANT
GLOVE SKINSENSE NS SZ6.5 (GLOVE)
GLOVE SKINSENSE NS SZ7.0 (GLOVE)
GLOVE SKINSENSE STRL SZ6.5 (GLOVE) IMPLANT
GLOVE SKINSENSE STRL SZ7.0 (GLOVE) IMPLANT
KIT VITRECTOMY (OPHTHALMIC RELATED) IMPLANT
LENS IOL ACRYSOF IQ TORIC 15.0 ×2 IMPLANT
PAD ARMBOARD 7.5X6 YLW CONV (MISCELLANEOUS) ×2 IMPLANT
PROC W NO LENS (INTRAOCULAR LENS)
PROC W SPEC LENS (INTRAOCULAR LENS) ×2
PROCESS W NO LENS (INTRAOCULAR LENS) IMPLANT
PROCESS W SPEC LENS (INTRAOCULAR LENS) ×1 IMPLANT
RING MALYGIN (MISCELLANEOUS) IMPLANT
SYR TB 1ML LL NO SAFETY (SYRINGE) ×2 IMPLANT
TAPE SURG TRANSPORE 1 IN (GAUZE/BANDAGES/DRESSINGS) ×1 IMPLANT
TAPE SURGICAL TRANSPORE 1 IN (GAUZE/BANDAGES/DRESSINGS) ×1
VISCOELASTIC ADDITIONAL (OPHTHALMIC RELATED) IMPLANT
WATER STERILE IRR 250ML POUR (IV SOLUTION) ×2 IMPLANT

## 2013-04-16 NOTE — H&P (Signed)
I have reviewed the H&P, the patient was re-examined, and I have identified no interval changes in medical condition and plan of care since the history and physical of record  

## 2013-04-16 NOTE — Transfer of Care (Signed)
Immediate Anesthesia Transfer of Care Note  Patient: Nichole Brewer  Procedure(s) Performed: Procedure(s) with comments: CATARACT EXTRACTION PHACO AND INTRAOCULAR LENS PLACEMENT (IOC) (Right) - CDE:15.88  Patient Location: Short Stay  Anesthesia Type:MAC  Level of Consciousness: awake, alert , oriented and patient cooperative  Airway & Oxygen Therapy: Patient Spontanous Breathing  Post-op Assessment: Report given to PACU RN  Post vital signs: Reviewed and stable  Complications: No apparent anesthesia complications

## 2013-04-16 NOTE — Op Note (Signed)
Date of Admission: 04/16/2013  Date of Surgery: 04/16/2013  Pre-Op Dx: Cataract Right Eye  Post-Op Dx: Cataract Right Eye,  Dx Code 366.16, Astigmatism Right Eye, Dx Code 626.94  Surgeon: Tonny Branch, M.D.  Assistants: None  Anesthesia: Topical with MAC  Indications: Painless, progressive loss of vision with compromise of daily activities.  Surgery: Cataract Extraction with Intraocular lens Implant Right Eye  Discription: The patient had dilating drops and viscous lidocaine placed into the Right in the pre-op holding area. In the sitting position horizontal reference marks were made on the cornea.  After transfer to the operating room, a time out was performed. The patient was then prepped and draped. Beginning with a 34 degree blade a paracentesis port was made at the surgeon's 2 o'clock position. The anterior chamber was then filled with 1% non-preserved lidocaine. This was followed by filling the anterior chamber with Provisc. A 2.56mm keratome blade was used to make a clear cornea incision at the temporal limbus. A bent cystatome needle was used to create a continuous tear capsulotomy. Hydrodissection was performed with balanced salt solution on a Fine canula. The lens nucleus was then removed using the phacoemulsification handpiece. Residual cortex was removed with the I&A handpiece. The anterior chamber and capsular bag were refilled with Provisc. A posterior chamber intraocular lens was placed into the capsular bag with it's injector. Additional corneal marks were made on the 76/256 degree meridians.  The Provisc was then removed from the anterior chamber and capsular bag with the I&A handpiece. The implant was positioned with the Kuglan hook. Stromal hydration of the main incision and paracentesis port was performed with BSS on a Fine canula. The wounds were tested for leak which was negative. The patient tolerated the procedure well. There were no operative complications. The patient was then  transferred to the recovery room in stable condition.  Complications: None  Specimen: None  EBL: None  Prosthetic device: Alcon Toric, [id number], power 13.0D , SN [id number].

## 2013-04-16 NOTE — Discharge Instructions (Signed)

## 2013-04-16 NOTE — Anesthesia Postprocedure Evaluation (Signed)
  Anesthesia Post-op Note  Patient: Nichole Brewer  Procedure(s) Performed: Procedure(s) with comments: CATARACT EXTRACTION PHACO AND INTRAOCULAR LENS PLACEMENT (IOC) (Right) - CDE:15.88  Patient Location: Short Stay  Anesthesia Type:MAC  Level of Consciousness: awake, alert , oriented and patient cooperative  Airway and Oxygen Therapy: Patient Spontanous Breathing  Post-op Pain: none  Post-op Assessment: Post-op Vital signs reviewed, Patient's Cardiovascular Status Stable, Respiratory Function Stable, Patent Airway and Pain level controlled  Post-op Vital Signs: Reviewed and stable  Complications: No apparent anesthesia complications

## 2013-04-16 NOTE — Anesthesia Preprocedure Evaluation (Signed)
Anesthesia Evaluation  Patient identified by MRN, date of birth, ID band Patient awake    Reviewed: Allergy & Precautions, H&P , NPO status , Patient's Chart, lab work & pertinent test results  History of Anesthesia Complications (+) PONV and history of anesthetic complications  Airway Mallampati: II TM Distance: >3 FB Neck ROM: Full    Dental  (+) Teeth Intact   Pulmonary neg pulmonary ROS,  breath sounds clear to auscultation        Cardiovascular negative cardio ROS  Rhythm:Regular Rate:Normal     Neuro/Psych    GI/Hepatic negative GI ROS,   Endo/Other    Renal/GU      Musculoskeletal   Abdominal   Peds  Hematology   Anesthesia Other Findings   Reproductive/Obstetrics                           Anesthesia Physical Anesthesia Plan  ASA: I  Anesthesia Plan: MAC   Post-op Pain Management:    Induction: Intravenous  Airway Management Planned: Nasal Cannula  Additional Equipment:   Intra-op Plan:   Post-operative Plan:   Informed Consent: I have reviewed the patients History and Physical, chart, labs and discussed the procedure including the risks, benefits and alternatives for the proposed anesthesia with the patient or authorized representative who has indicated his/her understanding and acceptance.     Plan Discussed with:   Anesthesia Plan Comments:         Anesthesia Quick Evaluation

## 2013-04-21 ENCOUNTER — Encounter (HOSPITAL_COMMUNITY): Payer: Self-pay | Admitting: Ophthalmology

## 2013-08-06 ENCOUNTER — Other Ambulatory Visit (HOSPITAL_COMMUNITY): Payer: Self-pay | Admitting: Obstetrics and Gynecology

## 2013-08-06 DIAGNOSIS — E2839 Other primary ovarian failure: Secondary | ICD-10-CM

## 2013-08-07 ENCOUNTER — Other Ambulatory Visit (HOSPITAL_COMMUNITY): Payer: Self-pay | Admitting: Obstetrics and Gynecology

## 2013-08-07 DIAGNOSIS — E2839 Other primary ovarian failure: Secondary | ICD-10-CM

## 2013-08-07 DIAGNOSIS — Z780 Asymptomatic menopausal state: Secondary | ICD-10-CM

## 2013-08-21 ENCOUNTER — Other Ambulatory Visit (HOSPITAL_COMMUNITY): Payer: BC Managed Care – PPO

## 2013-08-24 ENCOUNTER — Other Ambulatory Visit (HOSPITAL_COMMUNITY): Payer: BC Managed Care – PPO

## 2013-08-28 ENCOUNTER — Ambulatory Visit (HOSPITAL_COMMUNITY)
Admission: RE | Admit: 2013-08-28 | Discharge: 2013-08-28 | Disposition: A | Discharge status: Home / Self Care | Payer: BC Managed Care – PPO | Source: Ambulatory Visit | Attending: Obstetrics and Gynecology | Admitting: Obstetrics and Gynecology

## 2013-08-28 DIAGNOSIS — Z780 Asymptomatic menopausal state: Secondary | ICD-10-CM

## 2013-08-28 DIAGNOSIS — E2839 Other primary ovarian failure: Secondary | ICD-10-CM

## 2013-09-09 ENCOUNTER — Other Ambulatory Visit (HOSPITAL_COMMUNITY): Payer: Self-pay | Admitting: Internal Medicine

## 2013-09-09 DIAGNOSIS — M5416 Radiculopathy, lumbar region: Secondary | ICD-10-CM

## 2013-09-11 ENCOUNTER — Ambulatory Visit (HOSPITAL_COMMUNITY): Payer: BC Managed Care – PPO

## 2013-09-11 ENCOUNTER — Ambulatory Visit (HOSPITAL_COMMUNITY)
Admission: RE | Admit: 2013-09-11 | Discharge: 2013-09-11 | Disposition: A | Discharge status: Home / Self Care | Payer: BC Managed Care – PPO | Source: Ambulatory Visit | Attending: Internal Medicine | Admitting: Internal Medicine

## 2013-09-11 DIAGNOSIS — M545 Low back pain, unspecified: Secondary | ICD-10-CM | POA: Insufficient documentation

## 2013-09-11 DIAGNOSIS — M5416 Radiculopathy, lumbar region: Secondary | ICD-10-CM

## 2013-09-11 DIAGNOSIS — IMO0002 Reserved for concepts with insufficient information to code with codable children: Secondary | ICD-10-CM | POA: Insufficient documentation

## 2013-09-11 DIAGNOSIS — R209 Unspecified disturbances of skin sensation: Secondary | ICD-10-CM | POA: Insufficient documentation

## 2013-09-11 DIAGNOSIS — M5126 Other intervertebral disc displacement, lumbar region: Secondary | ICD-10-CM | POA: Diagnosis not present

## 2014-11-15 ENCOUNTER — Ambulatory Visit (INDEPENDENT_AMBULATORY_CARE_PROVIDER_SITE_OTHER): Payer: BLUE CROSS/BLUE SHIELD | Admitting: Orthopedic Surgery

## 2014-11-15 ENCOUNTER — Encounter: Payer: Self-pay | Admitting: Orthopedic Surgery

## 2014-11-15 ENCOUNTER — Ambulatory Visit (INDEPENDENT_AMBULATORY_CARE_PROVIDER_SITE_OTHER): Payer: BLUE CROSS/BLUE SHIELD

## 2014-11-15 VITALS — BP 123/80 | Ht 64.0 in | Wt 182.0 lb

## 2014-11-15 DIAGNOSIS — M25552 Pain in left hip: Secondary | ICD-10-CM

## 2014-11-15 DIAGNOSIS — Z9660 Presence of unspecified orthopedic joint implant: Secondary | ICD-10-CM

## 2014-11-15 DIAGNOSIS — M2170 Unequal limb length (acquired), unspecified site: Secondary | ICD-10-CM | POA: Diagnosis not present

## 2014-11-15 DIAGNOSIS — Z96649 Presence of unspecified artificial hip joint: Secondary | ICD-10-CM

## 2014-11-15 NOTE — Progress Notes (Signed)
Patient ID: Nichole Brewer, female   DOB: 02-14-1950, 64 y.o.   MRN: [id number]  Chief Complaint  Patient presents with  . Hip Problem    left hip pain and right leg feels longer    HPI Nichole Brewer is a 64 y.o. female.  This is a 64 year old female who comes in with "after having hip replacement I feel that my right leg is longer" timing is hip replacement was 6 years ago. She had no leg length discrepancy before the surgery. After surgery she had back pain and through that workup with an MRI in a series of multiple injections he was determined that her right hip is longer. Her surgery was done by Dr. Durward Fortes with a posterior approach. She denies pain swelling catching. It seems to be worse when she stands in the choir.  She noticed her symptoms a few months after the surgery.  Severity of symptoms not applicable the leg length discrepancy is not severe enough to require altering of her clothes. She did have some adjustment when she was walking and developed the back pain after the hip surgery.  Review of Systems Review of Systems We asked her to fill out our review of systems and she checked back pain and joint pain and left every other everything else normal   Past Medical History  Diagnosis Date  . Arthritis   . Osteoarthritis   . DJD (degenerative joint disease)   . PONV (postoperative nausea and vomiting)     after colonoscopy    Past Surgical History  Procedure Laterality Date  . Cesarean section  1986  . Joint replacement Right 2010    hip  . Cataract extraction w/phaco Left 04/06/2013    Procedure: CATARACT EXTRACTION PHACO AND INTRAOCULAR LENS PLACEMENT (IOC);  Surgeon: Tonny Branch, MD;  Location: [facility];  Service: Ophthalmology;  Laterality: Left;  CDE 13.72  . Cataract extraction w/phaco Right 04/16/2013    Procedure: CATARACT EXTRACTION PHACO AND INTRAOCULAR LENS PLACEMENT (IOC);  Surgeon: Tonny Branch, MD;  Location: [facility];  Service: Ophthalmology;   Laterality: Right;  CDE:15.88    No family history on file.  Social History Social History  Substance Use Topics  . Smoking status: Never Smoker   . Smokeless tobacco: None  . Alcohol Use: No    No Known Allergies  Current Outpatient Prescriptions  Medication Sig Dispense Refill  . amLODipine (NORVASC) 5 MG tablet Take 5 mg by mouth daily.    . diclofenac (VOLTAREN) 75 MG EC tablet Take 75 mg by mouth 2 (two) times daily.    . methocarbamol (ROBAXIN) 500 MG tablet Take 500 mg by mouth 4 (four) times daily.     No current facility-administered medications for this visit.       Physical Exam Physical Exam Blood pressure 123/80, height 5\' 4"  (1.626 m), weight 182 lb (82.555 kg). Appearance, there are no abnormalities in terms of appearance the patient was well-developed and well-nourished. The grooming and hygiene were normal.  Mental status orientation, there was normal alertness and orientation Mood pleasant Ambulatory status normal with no assistive devices  Examination of the lower extremities reveal the following. In the supine position she has normal range of motion and stability of both hips. On the right hip there is a posterior approach incision. She has no evidence of muscle atrophy.  Her right leg appears a half inch within the left measured by tape measure from ASIS to medial malleolus and then standing with the  block test she felt best and leveled out with half an inch. As far as her back goes she did have pelvic obliquity standing. Skin warm dry and intact without laceration or ulceration or erythema Neurologic examination normal sensation Vascular examination normal pulses with warm extremity and normal capillary refill      Data Reviewed I ordered an x-ray of her pelvis and hip and found that her prosthesis is in good position and she may have some mild arthritis in the left hip and that was independently read by me  Assessment  Encounter Diagnoses   Name Primary?  . Left hip pain   . S/P hip replacement   . Leg length discrepancy Yes      Plan  I recommended a half inch shoe lift on the left

## 2015-06-01 ENCOUNTER — Telehealth: Payer: Self-pay

## 2015-06-01 NOTE — Telephone Encounter (Signed)
[phone] or work [phone] ext [phone]  Patient received letter to schedule tcs

## 2015-06-10 DIAGNOSIS — N644 Mastodynia: Secondary | ICD-10-CM | POA: Diagnosis not present

## 2015-06-10 NOTE — Telephone Encounter (Signed)
LMOM both numbers for return call after Tuesday next week.

## 2015-06-14 ENCOUNTER — Telehealth: Payer: Self-pay

## 2015-06-14 ENCOUNTER — Other Ambulatory Visit: Payer: Self-pay | Admitting: Obstetrics and Gynecology

## 2015-06-14 DIAGNOSIS — N644 Mastodynia: Secondary | ICD-10-CM

## 2015-06-14 NOTE — Telephone Encounter (Signed)
See previous note. OV scheduled.

## 2015-06-14 NOTE — Telephone Encounter (Signed)
Pt called to be triaged. She woke up during her last colonoscopy and had nausea very bad. Scheduling her for an OV prior to colonoscopy. OV with Walden Field, NP on 06/29/2015 at 2:30 Pm.

## 2015-06-29 ENCOUNTER — Ambulatory Visit (INDEPENDENT_AMBULATORY_CARE_PROVIDER_SITE_OTHER): Payer: Medicare HMO | Admitting: Nurse Practitioner

## 2015-06-29 ENCOUNTER — Other Ambulatory Visit: Payer: Self-pay

## 2015-06-29 ENCOUNTER — Encounter: Payer: Self-pay | Admitting: Nurse Practitioner

## 2015-06-29 VITALS — BP 140/70 | HR 88 | Temp 98.4°F | Ht 63.5 in | Wt 189.2 lb

## 2015-06-29 DIAGNOSIS — R112 Nausea with vomiting, unspecified: Secondary | ICD-10-CM | POA: Diagnosis not present

## 2015-06-29 DIAGNOSIS — Z1211 Encounter for screening for malignant neoplasm of colon: Secondary | ICD-10-CM

## 2015-06-29 DIAGNOSIS — Z9889 Other specified postprocedural states: Secondary | ICD-10-CM | POA: Diagnosis not present

## 2015-06-29 MED ORDER — PEG 3350-KCL-NA BICARB-NACL 420 G PO SOLR: 4000.0000 mL | Freq: Once | ORAL | Status: DC

## 2015-06-29 NOTE — Assessment & Plan Note (Signed)
Patient had significant postoperative nausea and vomiting after her last colonoscopy which lasted remainder of the day. We will provide 12.5 mg Phenergan as pharmacy contraindicates 25 mg Phenergan. We will also make a point of it to anticipate possible need for Zofran pre-and postop. Return for follow-up based on postprocedure recommendations.

## 2015-06-29 NOTE — Assessment & Plan Note (Addendum)
She is currently 2 years overdue for her repeat screening colonoscopy. She is generally asymptomatic from a GI standpoint. Return for follow-up based on post procedure recommendations.  Proceed with TCS with Dr. Gala Romney in near future: the risks, benefits, and alternatives have been discussed with the patient in detail. The patient states understanding and desires to proceed.  The patient is not on any anticoagulants, anxiolytics, chronic pain medications, or antidepressants. She did wake up a couple times during her last procedure which she would prefer not to do. Also with significant postoperative nausea and vomiting as per above. We will plan to add 12.5 mg preprocedure Phenergan to help prevent this. Otherwise conscious sedation should be adequate for her procedure.  NOTE: May benefit from Zofran pre-procedure and/or post-procedure due to PONV.

## 2015-06-29 NOTE — Patient Instructions (Signed)
1. We will schedule your procedure for you. 2. Return for follow-up based on postprocedure recommendations. 

## 2015-06-29 NOTE — Progress Notes (Signed)
cc'ed to pcp °

## 2015-06-29 NOTE — Progress Notes (Signed)
Primary Care Physician:  Asencion Noble, MD Primary Gastroenterologist:  Dr. Gala Romney  Chief Complaint  Patient presents with  . Colonoscopy    HPI:   Nichole Brewer is a 65 y.o. female who presents to schedule surveillance colonoscopy. Last colonoscopy dated 02/26/2003 and found normal rectum, normal colon, normal terminal ileum. Recommended repeat colonoscopy in 10 years. She is currently approximately 2 years overdue. Phone triage was deferred to office visit due to patient complaint of waking up repeatedly during her last procedure and with significant postprocedure nausea. Her last procedure was performed with conscious sedation with Versed and Demerol.  Today she states she's doing well overall. Last colonoscopy "I woke up repeatedly and afterward I felt sick and was sick all day with nausea and vomiting." Denies abdominal pain, N/V, hematochezia, melena, fever, chills, unintentional weight loss. Does have chronic constipation, most stools consistent with Bristol 1, chronic "for all my life." Drinks minimal water, drinks a lot of sodas. Has several servings of whole grain breads/some vegetables daily. No sensation of incomplete emptying. Denies chest pain, dyspnea, lightheadedness, syncope, near syncope. Occasional dizziness when standing (questions if it's due to her BP medications.) Denies any other upper or lower GI symptoms.  Past Medical History  Diagnosis Date  . Arthritis   . Osteoarthritis   . DJD (degenerative joint disease)   . PONV (postoperative nausea and vomiting)     after colonoscopy    Past Surgical History  Procedure Laterality Date  . Cesarean section  1986  . Joint replacement Right 2010    hip  . Cataract extraction w/phaco Left 04/06/2013    Procedure: CATARACT EXTRACTION PHACO AND INTRAOCULAR LENS PLACEMENT (IOC);  Surgeon: Tonny Branch, MD;  Location: [facility];  Service: Ophthalmology;  Laterality: Left;  CDE 13.72  . Cataract extraction w/phaco Right 04/16/2013     Procedure: CATARACT EXTRACTION PHACO AND INTRAOCULAR LENS PLACEMENT (IOC);  Surgeon: Tonny Branch, MD;  Location: [facility];  Service: Ophthalmology;  Laterality: Right;  CDE:15.88    Current Outpatient Prescriptions  Medication Sig Dispense Refill  . amLODipine (NORVASC) 5 MG tablet Take 5 mg by mouth daily.    . diclofenac (VOLTAREN) 75 MG EC tablet Take 75 mg by mouth 2 (two) times daily.    . methocarbamol (ROBAXIN) 500 MG tablet Take 500 mg by mouth 4 (four) times daily. Reported on 06/14/2015     No current facility-administered medications for this visit.    Allergies as of 06/29/2015  . (No Known Allergies)    No family history on file.  Social History   Social History  . Marital Status: Divorced    Spouse Name: N/A  . Number of Children: N/A  . Years of Education: N/A   Occupational History  . Not on file.   Social History Main Topics  . Smoking status: Never Smoker   . Smokeless tobacco: Not on file  . Alcohol Use: No  . Drug Use: No  . Sexual Activity: Not on file   Other Topics Concern  . Not on file   Social History Narrative    Review of Systems: General: Negative for anorexia, weight loss, fever, chills, fatigue, weakness. ENT: Negative for hoarseness, difficulty swallowing. CV: Negative for chest pain, angina, palpitations, peripheral edema.  Respiratory: Negative for dyspnea at rest, cough, sputum, wheezing.  GI: See history of present illness.  MS: Negative for limitations resulting inability to lay flat for procedure.  Derm: Negative for rash or itching.  Endo: Negative  for unusual weight change.  Heme: Negative for bruising or bleeding. Allergy: Negative for rash or hives.    Physical Exam: BP 140/70 mmHg  Pulse 88  Temp(Src) 98.4 F (36.9 C) (Oral)  Ht 5' 3.5" (1.613 m)  Wt 189 lb 3.2 oz (85.821 kg)  BMI 32.99 kg/m2 General:   Alert and oriented. Pleasant and cooperative. Well-nourished and well-developed.  Head:  Normocephalic and  atraumatic. Eyes:  Without icterus, sclera clear and conjunctiva pink.  Ears:  Normal auditory acuity. Cardiovascular:  S1, S2 present without murmurs appreciated. Extremities without clubbing or edema. Respiratory:  Clear to auscultation bilaterally. No wheezes, rales, or rhonchi. No distress.  Gastrointestinal:  +BS, soft, non-tender and non-distended. No HSM noted. No guarding or rebound. No masses appreciated.  Rectal:  Deferred  Musculoskalatal:  Symmetrical without gross deformities. Neurologic:  Alert and oriented x4;  grossly normal neurologically. Psych:  Alert and cooperative. Normal mood and affect. Heme/Lymph/Immune: No excessive bruising noted.    06/29/2015 2:32 PM   Disclaimer: This note was dictated with voice recognition software. Similar sounding words can inadvertently be transcribed and may not be corrected upon review.

## 2015-07-22 ENCOUNTER — Encounter (HOSPITAL_COMMUNITY): Payer: Self-pay

## 2015-07-22 ENCOUNTER — Encounter (HOSPITAL_COMMUNITY)
Admission: RE | Disposition: A | Discharge status: Home / Self Care | Payer: Self-pay | Source: Ambulatory Visit | Attending: Internal Medicine

## 2015-07-22 ENCOUNTER — Ambulatory Visit (HOSPITAL_COMMUNITY)
Admission: RE | Admit: 2015-07-22 | Discharge: 2015-07-22 | Disposition: A | Discharge status: Home / Self Care | Payer: Medicare HMO | Source: Ambulatory Visit | Attending: Internal Medicine | Admitting: Internal Medicine

## 2015-07-22 DIAGNOSIS — Z79899 Other long term (current) drug therapy: Secondary | ICD-10-CM | POA: Insufficient documentation

## 2015-07-22 DIAGNOSIS — K5909 Other constipation: Secondary | ICD-10-CM | POA: Diagnosis not present

## 2015-07-22 DIAGNOSIS — Z1211 Encounter for screening for malignant neoplasm of colon: Secondary | ICD-10-CM | POA: Diagnosis not present

## 2015-07-22 DIAGNOSIS — Z9842 Cataract extraction status, left eye: Secondary | ICD-10-CM | POA: Insufficient documentation

## 2015-07-22 DIAGNOSIS — Z9841 Cataract extraction status, right eye: Secondary | ICD-10-CM | POA: Insufficient documentation

## 2015-07-22 DIAGNOSIS — Z96641 Presence of right artificial hip joint: Secondary | ICD-10-CM | POA: Diagnosis not present

## 2015-07-22 DIAGNOSIS — R112 Nausea with vomiting, unspecified: Secondary | ICD-10-CM | POA: Insufficient documentation

## 2015-07-22 DIAGNOSIS — K640 First degree hemorrhoids: Secondary | ICD-10-CM | POA: Insufficient documentation

## 2015-07-22 DIAGNOSIS — Z9889 Other specified postprocedural states: Secondary | ICD-10-CM | POA: Insufficient documentation

## 2015-07-22 DIAGNOSIS — M1990 Unspecified osteoarthritis, unspecified site: Secondary | ICD-10-CM | POA: Diagnosis not present

## 2015-07-22 HISTORY — DX: Essential (primary) hypertension: I10

## 2015-07-22 HISTORY — PX: COLONOSCOPY: SHX5424

## 2015-07-22 SURGERY — COLONOSCOPY
Anesthesia: Moderate Sedation

## 2015-07-22 MED ORDER — SODIUM CHLORIDE 0.9 % IV SOLN
INTRAVENOUS | Status: DC
  Administered 2015-07-22: 08:00:00 via INTRAVENOUS

## 2015-07-22 MED ORDER — PROMETHAZINE HCL 25 MG/ML IJ SOLN
INTRAMUSCULAR | Status: AC
  Filled 2015-07-22: qty 1

## 2015-07-22 MED ORDER — MEPERIDINE HCL 100 MG/ML IJ SOLN
INTRAMUSCULAR | Status: DC | PRN
  Administered 2015-07-22: 50 mg via INTRAVENOUS

## 2015-07-22 MED ORDER — STERILE WATER FOR IRRIGATION IR SOLN
Status: DC | PRN
  Administered 2015-07-22: 2.5 mL

## 2015-07-22 MED ORDER — MIDAZOLAM HCL 5 MG/5ML IJ SOLN
INTRAMUSCULAR | Status: AC
  Filled 2015-07-22: qty 10

## 2015-07-22 MED ORDER — ONDANSETRON HCL 4 MG/2ML IJ SOLN
INTRAMUSCULAR | Status: AC
  Filled 2015-07-22: qty 2

## 2015-07-22 MED ORDER — MIDAZOLAM HCL 5 MG/5ML IJ SOLN
INTRAMUSCULAR | Status: DC | PRN
  Administered 2015-07-22 (×2): 1 mg via INTRAVENOUS
  Administered 2015-07-22: 2 mg via INTRAVENOUS

## 2015-07-22 MED ORDER — ONDANSETRON HCL 4 MG/2ML IJ SOLN
INTRAMUSCULAR | Status: DC | PRN
  Administered 2015-07-22: 4 mg via INTRAVENOUS

## 2015-07-22 MED ORDER — PROMETHAZINE HCL 25 MG/ML IJ SOLN
12.5000 mg | Freq: Once | INTRAMUSCULAR | Status: AC
  Administered 2015-07-22: 12.5 mg via INTRAVENOUS

## 2015-07-22 MED ORDER — MEPERIDINE HCL 100 MG/ML IJ SOLN
INTRAMUSCULAR | Status: AC
  Filled 2015-07-22: qty 2

## 2015-07-22 MED ORDER — SODIUM CHLORIDE 0.9% FLUSH
INTRAVENOUS | Status: AC
  Filled 2015-07-22: qty 10

## 2015-07-22 NOTE — H&P (View-Only) (Signed)
Primary Care Physician:  Asencion Noble, MD Primary Gastroenterologist:  Dr. Gala Romney  Chief Complaint  Patient presents with  . Colonoscopy    HPI:   Nichole Brewer is a 65 y.o. female who presents to schedule surveillance colonoscopy. Last colonoscopy dated 02/26/2003 and found normal rectum, normal colon, normal terminal ileum. Recommended repeat colonoscopy in 10 years. She is currently approximately 2 years overdue. Phone triage was deferred to office visit due to patient complaint of waking up repeatedly during her last procedure and with significant postprocedure nausea. Her last procedure was performed with conscious sedation with Versed and Demerol.  Today she states she's doing well overall. Last colonoscopy "I woke up repeatedly and afterward I felt sick and was sick all day with nausea and vomiting." Denies abdominal pain, N/V, hematochezia, melena, fever, chills, unintentional weight loss. Does have chronic constipation, most stools consistent with Bristol 1, chronic "for all my life." Drinks minimal water, drinks a lot of sodas. Has several servings of whole grain breads/some vegetables daily. No sensation of incomplete emptying. Denies chest pain, dyspnea, lightheadedness, syncope, near syncope. Occasional dizziness when standing (questions if it's due to her BP medications.) Denies any other upper or lower GI symptoms.  Past Medical History  Diagnosis Date  . Arthritis   . Osteoarthritis   . DJD (degenerative joint disease)   . PONV (postoperative nausea and vomiting)     after colonoscopy    Past Surgical History  Procedure Laterality Date  . Cesarean section  1986  . Joint replacement Right 2010    hip  . Cataract extraction w/phaco Left 04/06/2013    Procedure: CATARACT EXTRACTION PHACO AND INTRAOCULAR LENS PLACEMENT (IOC);  Surgeon: Tonny Branch, MD;  Location: [facility];  Service: Ophthalmology;  Laterality: Left;  CDE 13.72  . Cataract extraction w/phaco Right 04/16/2013     Procedure: CATARACT EXTRACTION PHACO AND INTRAOCULAR LENS PLACEMENT (IOC);  Surgeon: Tonny Branch, MD;  Location: [facility];  Service: Ophthalmology;  Laterality: Right;  CDE:15.88    Current Outpatient Prescriptions  Medication Sig Dispense Refill  . amLODipine (NORVASC) 5 MG tablet Take 5 mg by mouth daily.    . diclofenac (VOLTAREN) 75 MG EC tablet Take 75 mg by mouth 2 (two) times daily.    . methocarbamol (ROBAXIN) 500 MG tablet Take 500 mg by mouth 4 (four) times daily. Reported on 06/14/2015     No current facility-administered medications for this visit.    Allergies as of 06/29/2015  . (No Known Allergies)    No family history on file.  Social History   Social History  . Marital Status: Divorced    Spouse Name: N/A  . Number of Children: N/A  . Years of Education: N/A   Occupational History  . Not on file.   Social History Main Topics  . Smoking status: Never Smoker   . Smokeless tobacco: Not on file  . Alcohol Use: No  . Drug Use: No  . Sexual Activity: Not on file   Other Topics Concern  . Not on file   Social History Narrative    Review of Systems: General: Negative for anorexia, weight loss, fever, chills, fatigue, weakness. ENT: Negative for hoarseness, difficulty swallowing. CV: Negative for chest pain, angina, palpitations, peripheral edema.  Respiratory: Negative for dyspnea at rest, cough, sputum, wheezing.  GI: See history of present illness.  MS: Negative for limitations resulting inability to lay flat for procedure.  Derm: Negative for rash or itching.  Endo: Negative  for unusual weight change.  Heme: Negative for bruising or bleeding. Allergy: Negative for rash or hives.    Physical Exam: BP 140/70 mmHg  Pulse 88  Temp(Src) 98.4 F (36.9 C) (Oral)  Ht 5' 3.5" (1.613 m)  Wt 189 lb 3.2 oz (85.821 kg)  BMI 32.99 kg/m2 General:   Alert and oriented. Pleasant and cooperative. Well-nourished and well-developed.  Head:  Normocephalic and  atraumatic. Eyes:  Without icterus, sclera clear and conjunctiva pink.  Ears:  Normal auditory acuity. Cardiovascular:  S1, S2 present without murmurs appreciated. Extremities without clubbing or edema. Respiratory:  Clear to auscultation bilaterally. No wheezes, rales, or rhonchi. No distress.  Gastrointestinal:  +BS, soft, non-tender and non-distended. No HSM noted. No guarding or rebound. No masses appreciated.  Rectal:  Deferred  Musculoskalatal:  Symmetrical without gross deformities. Neurologic:  Alert and oriented x4;  grossly normal neurologically. Psych:  Alert and cooperative. Normal mood and affect. Heme/Lymph/Immune: No excessive bruising noted.    06/29/2015 2:32 PM   Disclaimer: This note was dictated with voice recognition software. Similar sounding words can inadvertently be transcribed and may not be corrected upon review.

## 2015-07-22 NOTE — Op Note (Signed)
[facility] Patient Name: Nichole Brewer Procedure Date: 07/22/2015 8:16 AM MRN: [id number] Date of Birth: 02-26-1950 Attending MD: Norvel Richards , MD CSN: [id number] Age: 65 Admit Type: Outpatient Procedure:                Colonoscopy - screening Indications:              Screening for colorectal malignant neoplasm Providers:                Norvel Richards, MD, Lurline Del, RN Referring MD:              Medicines:                Midazolam 4 mg IV, Meperidine 50 mg IV, Ondansetron                            4 mg IV, Promethazine [id number] mg IV Complications:            No immediate complications. Estimated blood loss:                            None. Estimated Blood Loss:     Estimated blood loss: none. Procedure:                Pre-Anesthesia Assessment:                           - Prior to the procedure, a History and Physical                            was performed, and patient medications and                            allergies were reviewed. The patient's tolerance of                            previous anesthesia was also reviewed. The risks                            and benefits of the procedure and the sedation                            options and risks were discussed with the patient.                            All questions were answered, and informed consent                            was obtained. Prior Anticoagulants: The patient has                            taken no previous anticoagulant or antiplatelet                            agents. ASA Grade Assessment: II - A patient with  mild systemic disease. After reviewing the risks                            and benefits, the patient was deemed in                            satisfactory condition to undergo the procedure.                           After obtaining informed consent, the colonoscope                            was passed under direct vision. Throughout the                     procedure, the patient's blood pressure, pulse, and                            oxygen saturations were monitored continuously. The                            [id number]) was introduced through the anus                            and advanced to the the cecum, identified by                            appendiceal orifice and ileocecal valve. The                            colonoscopy was performed without difficulty. The                            patient tolerated the procedure well. The quality                            of the bowel preparation was adequate. The                            ileocecal valve, appendiceal orifice, and rectum                            were photographed. Scope In: 8:35:11 AM Scope Out: 8:53:45 AM Scope Withdrawal Time: 0 hours 12 minutes 52 seconds  Total Procedure Duration: 0 hours 18 minutes 34 seconds  Findings:      The perianal and digital rectal examinations were normal.      Internal hemorrhoids were found during retroflexion. The hemorrhoids       were Grade I (internal hemorrhoids that do not prolapse).      The exam was otherwise normal throughout the examined colon. Impression:               - Internal hemorrhoids. Normal colonoscopy                           - No specimens collected. Moderate Sedation:  Moderate (conscious) sedation was administered by the endoscopy nurse       and supervised by the endoscopist. The following parameters were       monitored: oxygen saturation, heart rate, blood pressure, respiratory       rate, EKG, adequacy of pulmonary ventilation, and response to care.       Total physician intraservice time was 24 minutes. Recommendation:           - Patient has a contact number available for                            emergencies. The signs and symptoms of potential                            delayed complications were discussed with the                            patient. Return to normal  activities tomorrow.                            Written discharge instructions were provided to the                            patient.                           - Advance diet as tolerated.                           - Continue present medications.                           - Repeat colonoscopy in 10 years for screening                            purposes.                           - Return to GI office PRN. Procedure Code(s):        --- Professional ---                           [phone], Colonoscopy, flexible; diagnostic, including                            collection of specimen(s) by brushing or washing,                            when performed (separate procedure)                           99152, Moderate sedation services provided by the                            same physician or other qualified health care  professional performing the diagnostic or                            therapeutic service that the sedation supports,                            requiring the presence of an independent trained                            observer to assist in the monitoring of the                            patient's level of consciousness and physiological                            status; initial 15 minutes of intraservice time,                            patient age 6 years or older                           [phone], Moderate sedation services; each additional                            15 minutes intraservice time Diagnosis Code(s):        --- Professional ---                           Z12.11, Encounter for screening for malignant                            neoplasm of colon                           K64.0, First degree hemorrhoids CPT copyright 2016 [employer]. All rights reserved. The codes documented in this report are preliminary and upon coder review may  be revised to meet current compliance requirements. Nichole Brewer. Nichole Frayre, MD Norvel Richards, MD 07/22/2015 9:02:32 AM This report has been signed electronically. Number of Addenda: 0

## 2015-07-22 NOTE — Interval H&P Note (Signed)
History and Physical Interval Note:  07/22/2015 8:26 AM  Nichole Brewer  has presented today for surgery, with the diagnosis of screening colonoscopy  The various methods of treatment have been discussed with the patient and family. After consideration of risks, benefits and other options for treatment, the patient has consented to  Procedure(s) with comments: COLONOSCOPY (N/A) - 0830 as a surgical intervention .  The patient's history has been reviewed, patient examined, no change in status, stable for surgery.  I have reviewed the patient's chart and labs.  Questions were answered to the patient's satisfaction.     Nichole Brewer   No change. A screening colonoscopy per plan. The risks, benefits, limitations, alternatives and imponderables have been reviewed with the patient. Questions have been answered. All parties are agreeable.

## 2015-07-22 NOTE — Discharge Instructions (Signed)

## 2015-08-04 ENCOUNTER — Encounter (HOSPITAL_COMMUNITY): Payer: Self-pay | Admitting: Internal Medicine

## 2015-08-11 DIAGNOSIS — D225 Melanocytic nevi of trunk: Secondary | ICD-10-CM | POA: Diagnosis not present

## 2015-08-11 DIAGNOSIS — L821 Other seborrheic keratosis: Secondary | ICD-10-CM | POA: Diagnosis not present

## 2015-08-22 ENCOUNTER — Ambulatory Visit (INDEPENDENT_AMBULATORY_CARE_PROVIDER_SITE_OTHER): Payer: Medicare HMO | Admitting: Orthopedic Surgery

## 2015-08-22 ENCOUNTER — Encounter: Payer: Self-pay | Admitting: Orthopedic Surgery

## 2015-08-22 VITALS — BP 155/99 | Ht 63.0 in | Wt 190.0 lb

## 2015-08-22 DIAGNOSIS — M4806 Spinal stenosis, lumbar region: Secondary | ICD-10-CM | POA: Diagnosis not present

## 2015-08-22 DIAGNOSIS — M48061 Spinal stenosis, lumbar region without neurogenic claudication: Secondary | ICD-10-CM

## 2015-08-22 DIAGNOSIS — Z9660 Presence of unspecified orthopedic joint implant: Secondary | ICD-10-CM

## 2015-08-22 DIAGNOSIS — Z96649 Presence of unspecified artificial hip joint: Secondary | ICD-10-CM

## 2015-08-22 MED ORDER — GABAPENTIN 300 MG PO CAPS
300.0000 mg | ORAL_CAPSULE | Freq: Every day | ORAL | 2 refills | Status: DC
Start: 2015-08-22 — End: 2018-12-17

## 2015-08-22 NOTE — Patient Instructions (Signed)
Take gabapentin 300 mg at night

## 2015-08-22 NOTE — Progress Notes (Signed)
Patient ID: Nichole Brewer, female   DOB: 12-21-1950, 65 y.o.   MRN: [id number]  Chief Complaint  Patient presents with  . Follow-up    left hip pain    HPI 65 year old female status post right total hip done elsewhere presents with left "hip pain". Actually complains of pain in the lower back radiating into the left hip and left thigh. Review of Systems  Constitutional: Negative for chills, fever, malaise/fatigue and weight loss.  Gastrointestinal: Negative for constipation and diarrhea.  Genitourinary: Negative for dysuria, frequency and urgency.      There were no vitals taken for this visit. Gen. appearance is normal grooming and hygiene Orientation to person place and time normal Mood normal Gait is normal  No peripheral edema or swelling is noted in the right and left leg Sensory exam shows normal sensation to palpation, pressure and soft touch Skin exam no lacerations ulcerations or erythema  Ortho Exam Both hips move normally without pain.  She does have tenderness in the lower back and down the lateral aspect of both legs  A/P  Medical decision-making  Encounter Diagnoses  Name Primary?  . S/P hip replacement   . Spinal stenosis, lumbar region, without neurogenic claudication Yes    We discussed the options with spinal stenosis and recommended gabapentin 300mg  at bedtime  Follow-up as needed  Arther Abbott, MD 08/22/2015 1:37 PM

## 2015-09-01 ENCOUNTER — Other Ambulatory Visit: Payer: Self-pay | Admitting: *Deleted

## 2015-09-01 ENCOUNTER — Telehealth: Payer: Self-pay | Admitting: Orthopedic Surgery

## 2015-09-01 DIAGNOSIS — M48061 Spinal stenosis, lumbar region without neurogenic claudication: Secondary | ICD-10-CM

## 2015-09-01 NOTE — Telephone Encounter (Signed)
Patient called with question regarding:  gabapentin (NEURONTIN) 300 MG capsule 30 capsule 2 08/22/2015    Sig - Route: Take 1 capsule (300 mg total) by mouth daily at 10 pm. - Oral    - states has been taking since prescribed, and does not feel it is helping much. States uses [employer]. Please advise. ph [phone] (Mobile)

## 2015-09-01 NOTE — Telephone Encounter (Signed)
Send her to therapy   Add diclofenac 50 mg bid

## 2015-09-01 NOTE — Telephone Encounter (Signed)
PATIENT STATES SHE ALREADY TAKES THE DICLOFENAC  AGREES TO THERAPY, ORDERS TO BE ENTERED

## 2015-09-01 NOTE — Telephone Encounter (Signed)
Routing to Dr. Harrison to advise 

## 2015-09-02 ENCOUNTER — Encounter (HOSPITAL_COMMUNITY): Payer: Self-pay

## 2015-09-15 ENCOUNTER — Ambulatory Visit (HOSPITAL_COMMUNITY): Payer: Medicare HMO | Attending: Orthopedic Surgery | Admitting: Physical Therapy

## 2015-09-15 DIAGNOSIS — M6281 Muscle weakness (generalized): Secondary | ICD-10-CM | POA: Insufficient documentation

## 2015-09-15 DIAGNOSIS — M545 Low back pain, unspecified: Secondary | ICD-10-CM

## 2015-09-15 DIAGNOSIS — R29898 Other symptoms and signs involving the musculoskeletal system: Secondary | ICD-10-CM | POA: Insufficient documentation

## 2015-09-15 DIAGNOSIS — R293 Abnormal posture: Secondary | ICD-10-CM | POA: Insufficient documentation

## 2015-09-15 NOTE — Patient Instructions (Signed)
   SINGLE KNEE TO CHEST STRETCH - [place]  While Lying on your back, hold your knee and gently pull it up towards your chest.  Hold it in this position for 5 seconds, then relax; this should be passive, with your arms doing the majority of the work.   Repeat 5 times each side, twice a day.     Lumbar Rotations   Lying on your back with your knees bent, slowly drop your legs to one side and hold the stretch. Come back to the middle and switch sides. You should feel the stretch in your back on the opposite side that your legs are leaning. Make sure you are keeping your core tight with your belly button squeeze to your spine.   Hold this position for 5 seconds, then rotate to the other side; repeat 5 times each side, twice a day.    HAMSTRING STRETCH - TABLE, BED OR COUCH  Sit on a raised flat surface where you can prop your affected leg up on it such as a treatment table, couch or bed.    While keeping your knee straight to slightly bent, slowly lean forward and reach your hands towards your foot until a gentle stretch is felt along the back of your knee/thigh.   Hold for 30 seconds; you should feet a stretching/pulling sensation on the back of your thigh/behind your knee.   Repeat twice each side, twice a day.

## 2015-09-15 NOTE — Therapy (Signed)
[facility] [address] [place], [place], [address] Phone: [phone]   Fax:  [phone]  Physical Therapy Evaluation  Patient Details  Name: Nichole Brewer MRN: [id number] Date of Birth: [birth year] Referring Provider: Arther Abbott   Encounter Date: 09/15/2015      PT End of Session - 09/15/15 1217    Visit Number 1   Number of Visits 12   Date for PT Re-Evaluation 10/06/15   Authorization Type [employer] PPO    Authorization Time Period 09/15/15 to 10/27/15   Authorization - Visit Number 1   Authorization - Number of Visits 10   PT Start Time 1119   PT Stop Time 1159   PT Time Calculation (min) 40 min   Activity Tolerance Patient tolerated treatment well   Behavior During Therapy [facility] for tasks assessed/performed      Past Medical History:  Diagnosis Date  . Arthritis   . DJD (degenerative joint disease)   . Hypertension   . Osteoarthritis   . PONV (postoperative nausea and vomiting)    after colonoscopy    Past Surgical History:  Procedure Laterality Date  . CATARACT EXTRACTION W/PHACO Left 04/06/2013   Procedure: CATARACT EXTRACTION PHACO AND INTRAOCULAR LENS PLACEMENT (IOC);  Surgeon: Tonny Branch, MD;  Location: [facility];  Service: Ophthalmology;  Laterality: Left;  CDE 13.72  . CATARACT EXTRACTION W/PHACO Right 04/16/2013   Procedure: CATARACT EXTRACTION PHACO AND INTRAOCULAR LENS PLACEMENT (IOC);  Surgeon: Tonny Branch, MD;  Location: [facility]S;  Service: Ophthalmology;  Laterality: Right;  CDE:15.88  . CESAREAN SECTION  1986  . COLONOSCOPY N/A 07/22/2015   Procedure: COLONOSCOPY;  Surgeon: Daneil Dolin, MD;  Location: [facility] ENDO SUITE;  Service: Endoscopy;  Laterality: N/A;  0830  . JOINT REPLACEMENT Right 2010   hip    There were no vitals filed for this visit.       Subjective Assessment - 09/15/15 1121    Subjective Patient states that she has had on-going back pain but back in the spring she noticed more  pain with walking. It was a similar pain that caused her to have hip replacement, however she states that this pain did not resolve even when she did get her hip surgery. Right now it is the L side bothering her; she cannot tolerate laying on her L side, she is woken up at night by pain. The pain does not run down the back of her legs, it is more the sides; she reports her back gets tired quickly and has trouble standing for a long period of time. She does have some numbness and tingling going on in her feet. No bowel or bladder problems or changes. Her balance has been OK recently. Walking is the hardest thing for her to do right now.    Pertinent History OA, DJD, R hip replacement (lateral approach per patient) in 2010   How long can you sit comfortably? unlimited   How long can you stand comfortably? 30 minutes    How long can you walk comfortably? immediate discomfort    Diagnostic tests See MRI from October 2015 (most recent)   Patient Stated Goals get rid of pain, walk further, enjoy being active again    Currently in Pain? No/denies  but can get up to 7/10 with walking             [facility] PT Assessment - 09/15/15 0001      Assessment   Medical Diagnosis  spinal stenosis    Referring Provider Arther Abbott    Onset Date/Surgical Date --  chronic but really got worse spring 2017   Next MD Visit none scheduled, only if needed      Balance Screen   Has the patient fallen in the past 6 months No   Has the patient had a decrease in activity level because of a fear of falling?  No   Is the patient reluctant to leave their home because of a fear of falling?  No     Prior Function   Level of Independence Independent;Independent with basic ADLs;Independent with gait;Independent with transfers   Vocation Part time employment   Vocation Requirements GED program, lots of sitting    Leisure reading, crochet, caring for grandkids      Observation/Other Assessments   Observations scour (-)  L, FABER (-) L, SLR (-) L; DNT R due to presence of prosthetic hip/reduced likelyhood of hip involvement on this side    Focus on Therapeutic Outcomes (FOTO)  49% limited      AROM   Lumbar Flexion approximately 6 inches from floor    Lumbar Extension moderate limitation, hypomobile lumbar area, hip extension compensation     Lumbar - Right Side Bend fingertips approx 1 inch past midline of knee joint    Lumbar - Left Side Bend fingertips just above midline of knee joint      Strength   Right Hip Flexion 5/5   Right Hip Extension 3/5   Right Hip ABduction 4-/5   Left Hip Flexion 3/5  pain limited lateral hip    Left Hip Extension 3/5   Left Hip ABduction 2/5  pain limited lateral L hip    Right Knee Flexion 4/5   Right Knee Extension 4+/5   Left Knee Flexion 4/5   Left Knee Extension 4+/5   Right Ankle Dorsiflexion 4+/5   Left Ankle Dorsiflexion 4+/5     Flexibility   Hamstrings moderate limitation L    Piriformis moderate limitation L      Palpation   Palpation comment able to reproduce lateral hip pain with manual palpation of lateral L hip/distal glutes/piriformis/belly of TFL and ITB on L; noted muscle tension B paraspinals; hypomobilty noted with cautious PAs lumbar and lower thoracic spines, sacrum      Ambulation/Gait   Gait Comments favoring of L LE with reduced stance time L/step length R; pronation B; flexed at hips; proximal weakness      6 minute walk test results    Aerobic Endurance Distance Walked 663   Endurance additional comments 3MWT      Timed Up and Go Test   Normal TUG (seconds) 10.6                           PT Education - 09/15/15 1216    Education provided Yes   Education Details prognosis, POC, HEP    Person(s) Educated Patient   Methods Explanation;Demonstration;Handout   Comprehension Verbalized understanding;Returned demonstration;Need further instruction          PT Short Term Goals - 09/15/15 1226      PT SHORT  TERM GOAL #1   Title Patient to be able to maintain correct posture at least 75% of the time in order to assist in reducing pain and improving mechanics    Time 3   Period Weeks   Status New     PT SHORT TERM GOAL #  2   Title Patient to demonstrate bilateral hip ROM and all lumbar ROM as being [facility] with no exacerbation in pain in order to improve functional task performance    Time 3   Period Weeks   Status New     PT SHORT TERM GOAL #3   Title Pain to experience pain no more than 4/10 in order to improve functional task performance and tolerance, QOL    Time 3   Period Weeks   Status New     PT SHORT TERM GOAL #4   Title Patient to be able to correctly and consistently perform appropriate HEP, to be updated PRN    Time 3   Period Weeks   Status New           PT Long Term Goals - 09/15/15 1229      PT LONG TERM GOAL #1   Title Patient to demonstrate strength 5/5 in order to assist in reducing pain and improving functional task performance/endurance    Time 6   Period Weeks   Status New     PT LONG TERM GOAL #2   Title Patient to demonstrate correct mechanics for functional lifting in order to prevent exacerbation or re-occurrence of pain    Time 6   Period Weeks   Status New     PT LONG TERM GOAL #3   Title Patient to be able to perform all activities of her PLOF with pain no more than 2/10 to improve general QOL    Time 6   Period Weeks   Status New     PT LONG TERM GOAL #4   Title Patient to be participatory in regular aerobic exercise program, to be at least 20 minutes in duration, at least 4 days per week, in order to maintain functional gains and improve overall health status    Time 6   Period Weeks   Status New               Plan - 09/15/15 1219    Clinical Impression Statement Patient arrives with primary complaint of LBP which has always been somewhat present, but of which increased significantly this spring; the pain was present before her R  hip replacement (2010) and she reports that hip surgery did not resolve it, only increased her symptoms on the L. Her pain runs primarily down the lateral side of her L LE and is reproduced, "feels like you are pressing on a bruise", by manual palpation of lateral glutes/TFL/ITB. She also demonstrates postural limitations and gait deviations, reduced muscle flexibility, functional weakness, lumbar and bilateral hip stiffness, and reduced tolerance to extended functional activities. No peripheralization/centralization of symptoms with repeated movements. Patient able to safely and effectively perform assigned HEP today; recommend skilled PT services to address functional limitations and assist in reaching optimal level of function.    Rehab Potential Good   PT Frequency 2x / week   PT Duration 6 weeks   PT Treatment/Interventions ADLs/Self Care Home Management;Biofeedback;Cryotherapy;Moist Heat;Gait training;Stair training;Functional mobility training;Therapeutic activities;Therapeutic exercise;Balance training;Neuromuscular re-education;Patient/family education;Manual techniques;Passive range of motion;Energy conservation;Taping   PT Next Visit Plan review HEP and goals; continue with lumbar mobility exercises in positions of lumbar flexion, proximal strength, posture training    PT Home Exercise Plan At eval  8/31: SKTC, lumbar rotation, hamstring stretch    Consulted and Agree with Plan of Care Patient      Patient will benefit from skilled therapeutic intervention in order to improve  the following deficits and impairments:  Abnormal gait, Impaired sensation, Improper body mechanics, Pain, Decreased coordination, Decreased mobility, Increased muscle spasms, Postural dysfunction, Hypomobility, Decreased strength, Difficulty walking, Impaired flexibility  Visit Diagnosis: Midline low back pain without sciatica - Plan: PT plan of care cert/re-cert  Abnormal posture - Plan: PT plan of care  cert/re-cert  Muscle weakness (generalized) - Plan: PT plan of care cert/re-cert  Other symptoms and signs involving the musculoskeletal system - Plan: PT plan of care cert/re-cert      G-Codes - [id number]    Functional Assessment Tool Used Based on skilled clinical assessment of strength, gait, ROM, pain    Functional Limitation Mobility: Walking and moving around   Mobility: Walking and Moving Around Current Status [id number]) At least 40 percent but less than 60 percent impaired, limited or restricted   Mobility: Walking and Moving Around Goal Status [phone]) At least 20 percent but less than 40 percent impaired, limited or restricted       Problem List Patient Active Problem List   Diagnosis Date Noted  . Post-operative nausea and vomiting   . Encounter for screening colonoscopy 06/29/2015  . PONV (postoperative nausea and vomiting) 06/29/2015    Deniece Ree PT, DPT [facility] [address] [place], [place], [address] Phone: [phone]   Fax:  [phone]  Name: Nichole Brewer MRN: [id number] Date of Birth: Jun 28, 1950

## 2015-09-20 DIAGNOSIS — M4806 Spinal stenosis, lumbar region: Secondary | ICD-10-CM | POA: Diagnosis not present

## 2015-09-20 DIAGNOSIS — M5416 Radiculopathy, lumbar region: Secondary | ICD-10-CM | POA: Diagnosis not present

## 2015-09-23 ENCOUNTER — Ambulatory Visit (HOSPITAL_COMMUNITY): Payer: Medicare HMO | Attending: Orthopedic Surgery

## 2015-09-23 DIAGNOSIS — M545 Low back pain, unspecified: Secondary | ICD-10-CM

## 2015-09-23 DIAGNOSIS — R29898 Other symptoms and signs involving the musculoskeletal system: Secondary | ICD-10-CM | POA: Diagnosis not present

## 2015-09-23 DIAGNOSIS — R293 Abnormal posture: Secondary | ICD-10-CM

## 2015-09-23 DIAGNOSIS — M6281 Muscle weakness (generalized): Secondary | ICD-10-CM

## 2015-09-23 NOTE — Therapy (Signed)
[place], [place], [address] Phone: [phone]   Fax:  [phone]  Physical Therapy Treatment  Patient Details  Name: Nichole Brewer MRN: [id number] Date of Birth: 1950-06-20 Referring Provider: Arther Abbott   Encounter Date: 09/23/2015      PT End of Session - 09/23/15 1053    Visit Number 2   Number of Visits 12   Date for PT Re-Evaluation 10/06/15   Authorization Type [employer] PPO    Authorization Time Period 09/15/15 to 10/27/15   Authorization - Visit Number 2   Authorization - Number of Visits 10   PT Start Time 1034   PT Stop Time 1125   PT Time Calculation (min) 51 min   Equipment Utilized During Treatment Gait belt   Activity Tolerance Patient tolerated treatment well;No increased pain   Behavior During Therapy WFL for tasks assessed/performed      Past Medical History:  Diagnosis Date  . Arthritis   . DJD (degenerative joint disease)   . Hypertension   . Osteoarthritis   . PONV (postoperative nausea and vomiting)    after colonoscopy    Past Surgical History:  Procedure Laterality Date  . CATARACT EXTRACTION W/PHACO Left 04/06/2013   Procedure: CATARACT EXTRACTION PHACO AND INTRAOCULAR LENS PLACEMENT (IOC);  Surgeon: Tonny Branch, MD;  Location: [facility];  Service: Ophthalmology;  Laterality: Left;  CDE 13.72  . CATARACT EXTRACTION W/PHACO Right 04/16/2013   Procedure: CATARACT EXTRACTION PHACO AND INTRAOCULAR LENS PLACEMENT (IOC);  Surgeon: Tonny Branch, MD;  Location: [facility];  Service: Ophthalmology;  Laterality: Right;  CDE:15.88  . CESAREAN SECTION  1986  . COLONOSCOPY N/A 07/22/2015   Procedure: COLONOSCOPY;  Surgeon: Daneil Dolin, MD;  Location: [facility];  Service: Endoscopy;  Laterality: N/A;  0830  . JOINT REPLACEMENT Right 2010   hip    There were no vitals filed for this visit.      Subjective Assessment - 09/23/15 1037    Subjective Pt reports she has been doin  g fairly well. She says the HEP seems helpful, and she has good and bad days, but overall feeling ok. She saw her spine doctor last week he recommended PT screen for hip bursitis issues as well.    Pertinent History OA, DJD, R hip replacement (lateral approach per patient) in 2010   Currently in Pain? No/denies                         [facility] Adult PT Treatment/Exercise - 09/23/15 0001      Exercises   Exercises Lumbar     Lumbar Exercises: Stretches   Active Hamstring Stretch 30 seconds;2 reps  long sitting in stretch side (HEP review)    Single Knee to Chest Stretch 5 reps  BIL; 5x5seconds (HEP review)   Lower Trunk Rotation 5 reps  5x5sec bilat+ TrAbd Stretch (HEP Review)   Hip Flexor Stretch 2 reps;60 seconds  Prone Quads Straps Stretch c Add as tolerated. (HEP addition     Lumbar Exercises: Supine   Other Supine Lumbar Exercises Supine ER STRETCH, knee at 90* Left: 2x60, ADD as tolerated  2x60s (HEP addition)   Other Supine Lumbar Exercises Supine FADIR stretch c contralat assistance   2x60sec HEP addition     Modalities   Modalities Moist Heat     Moist Heat Therapy   Number Minutes Moist Heat 10 Minutes  applied concurrently while  working on other muscles.    Moist Heat Location Other (comment)  Left Vastus Lateralis/Rectus Femoris      Manual Therapy   Manual Therapy Soft tissue mobilization;Myofascial release   Soft tissue mobilization MFR Vastus Lateralis 19min   Myofascial Release Trigger Point Release: 15 Minutes  Left: Vastus Lateralis, TFL, Glute Med.                   PT Short Term Goals - 09/15/15 1226      PT SHORT TERM GOAL #1   Title Patient to be able to maintain correct posture at least 75% of the time in order to assist in reducing pain and improving mechanics    Time 3   Period Weeks   Status New     PT SHORT TERM GOAL #2   Title Patient to demonstrate bilateral hip ROM and all lumbar ROM as being [facility] with no  exacerbation in pain in order to improve functional task performance    Time 3   Period Weeks   Status New     PT SHORT TERM GOAL #3   Title Pain to experience pain no more than 4/10 in order to improve functional task performance and tolerance, QOL    Time 3   Period Weeks   Status New     PT SHORT TERM GOAL #4   Title Patient to be able to correctly and consistently perform appropriate HEP, to be updated PRN    Time 3   Period Weeks   Status New           PT Long Term Goals - 09/15/15 1229      PT LONG TERM GOAL #1   Title Patient to demonstrate strength 5/5 in order to assist in reducing pain and improving functional task performance/endurance    Time 6   Period Weeks   Status New     PT LONG TERM GOAL #2   Title Patient to demonstrate correct mechanics for functional lifting in order to prevent exacerbation or re-occurrence of pain    Time 6   Period Weeks   Status New     PT LONG TERM GOAL #3   Title Patient to be able to perform all activities of her PLOF with pain no more than 2/10 to improve general QOL    Time 6   Period Weeks   Status New     PT LONG TERM GOAL #4   Title Patient to be participatory in regular aerobic exercise program, to be at least 20 minutes in duration, at least 4 days per week, in order to maintain functional gains and improve overall health status    Time 6   Period Weeks   Status New               Plan - 09/23/15 1048    Clinical Impression Statement HEP and goals reviewed this session, pt expressing contentment with both. Form for HEP is corrected. Additional time spent in [facility] to tender spots in glutes on Left. Pt able to complete entire session without exacerbation of symptoms. Extensive time spend releasing multiple trigger points throughout the lateral thigh and hip with excellent response and resolution of many painful areas. Vastus lateralis seems to be the most uncomfortabel area, and although rectus femoris is noted to  have some large taut bands, these do not reproduce the patient's afamiliar pain. All Trigger point release is follow with myfoascial release, moist heat (quads only),  and extensive low load long duration stretching that is addapted to home program. The patient feels more limber and pain free when coming to standing and performing some AMB immediately after session. Pt making good progress toward goals.    Rehab Potential Good   PT Frequency 2x / week   PT Duration 6 weeks   PT Treatment/Interventions ADLs/Self Care Home Management;Biofeedback;Cryotherapy;Moist Heat;Gait training;Stair training;Functional mobility training;Therapeutic activities;Therapeutic exercise;Balance training;Neuromuscular re-education;Patient/family education;Manual techniques;Passive range of motion;Energy conservation;Taping   PT Next Visit Plan Review new HEP stretches, get feedback regarding response to previous session's manual therapy. Begin to incorporate some seated hip rotator strength if pain is more stable or continue with manual and corestabilization if glutes are not yet ready for conditioning.    PT Home Exercise Plan EVAL 8/31: SKTC, lumbar rotation, hamstring stretch; 9/8 Supine ER stretch (L), Supine FADIR stretch (L), Prone Quads stretch + Adduction;    Consulted and Agree with Plan of Care Patient      Patient will benefit from skilled therapeutic intervention in order to improve the following deficits and impairments:  Abnormal gait, Impaired sensation, Improper body mechanics, Pain, Decreased coordination, Decreased mobility, Increased muscle spasms, Postural dysfunction, Hypomobility, Decreased strength, Difficulty walking, Impaired flexibility  Visit Diagnosis: Midline low back pain without sciatica  Abnormal posture  Muscle weakness (generalized)  Other symptoms and signs involving the musculoskeletal system     Problem List Patient Active Problem List   Diagnosis Date Noted  .  Post-operative nausea and vomiting   . Encounter for screening colonoscopy 06/29/2015  . PONV (postoperative nausea and vomiting) 06/29/2015    11:44 AM, 09/23/15 Etta Grandchild, PT, DPT Physical Therapist at [place] [phone] (office)     [place], [place], [address] Phone: [phone]   Fax:  [phone]  Name: Nichole Brewer MRN: [id number] Date of Birth: 1950/01/28

## 2015-09-27 ENCOUNTER — Ambulatory Visit (HOSPITAL_COMMUNITY): Payer: Medicare HMO

## 2015-09-27 DIAGNOSIS — M545 Low back pain, unspecified: Secondary | ICD-10-CM

## 2015-09-27 DIAGNOSIS — M6281 Muscle weakness (generalized): Secondary | ICD-10-CM

## 2015-09-27 DIAGNOSIS — R293 Abnormal posture: Secondary | ICD-10-CM

## 2015-09-27 DIAGNOSIS — M5416 Radiculopathy, lumbar region: Secondary | ICD-10-CM | POA: Diagnosis not present

## 2015-09-27 DIAGNOSIS — M4806 Spinal stenosis, lumbar region: Secondary | ICD-10-CM | POA: Diagnosis not present

## 2015-09-27 DIAGNOSIS — R29898 Other symptoms and signs involving the musculoskeletal system: Secondary | ICD-10-CM

## 2015-09-27 NOTE — Patient Instructions (Signed)
Bridge    Lie back, legs bent. Inhale, pressing hips up. Keeping ribs in, lengthen lower back. Exhale, rolling down along spine from top. Repeat 10-15 times. Do 1-2 sessions per day.  http://pm.exer.us/55   Copyright  VHI. All rights reserved.   Hip Abduction / Adduction: with Extended Knee (Supine)    Bring left leg out to side and return. Keep knee straight with theraband around legs. Repeat 10-20 times per set. Do 1-2 sets per session. Do 3-5 days per week http://orth.exer.us/681   Copyright  VHI. All rights reserved.

## 2015-09-27 NOTE — Therapy (Signed)
[place], [place], [address] Phone: [phone]   Fax:  [phone]  Physical Therapy Treatment  Patient Details  Name: MATALYNN Brewer MRN: [id number] Date of Birth: Jul 31, 1950 Referring Provider: Arther Abbott   Encounter Date: 09/27/2015      PT End of Session - 09/27/15 0913    Visit Number 3   Number of Visits 12   Date for PT Re-Evaluation 10/06/15   Authorization Type [employer] Medicare PPO    Authorization Time Period 09/15/15 to 10/27/15   Authorization - Visit Number 3   Authorization - Number of Visits 10   PT Start Time 0818   PT Stop Time 0903   PT Time Calculation (min) 45 min   Activity Tolerance Patient tolerated treatment well;No increased pain   Behavior During Therapy WFL for tasks assessed/performed      Past Medical History:  Diagnosis Date  . Arthritis   . DJD (degenerative joint disease)   . Hypertension   . Osteoarthritis   . PONV (postoperative nausea and vomiting)    after colonoscopy    Past Surgical History:  Procedure Laterality Date  . CATARACT EXTRACTION W/PHACO Left 04/06/2013   Procedure: CATARACT EXTRACTION PHACO AND INTRAOCULAR LENS PLACEMENT (IOC);  Surgeon: Tonny Branch, MD;  Location: [facility];  Service: Ophthalmology;  Laterality: Left;  CDE 13.72  . CATARACT EXTRACTION W/PHACO Right 04/16/2013   Procedure: CATARACT EXTRACTION PHACO AND INTRAOCULAR LENS PLACEMENT (IOC);  Surgeon: Tonny Branch, MD;  Location: [facility];  Service: Ophthalmology;  Laterality: Right;  CDE:15.88  . CESAREAN SECTION  1986  . COLONOSCOPY N/A 07/22/2015   Procedure: COLONOSCOPY;  Surgeon: Daneil Dolin, MD;  [facility]: [facility];  Service: Endoscopy;  Laterality: N/A;  0830  . JOINT REPLACEMENT Right 2010   hip    There were no vitals filed for this visit.      Subjective Assessment - 09/27/15 0821    Subjective Pt reports she is doing very well today.  Reports she feels the HEP seems  helpful and feels it helps her gait.  Minimal pain maybe a 2/10 Lt hip.     Pertinent History OA, DJD, R hip replacement (lateral approach per patient) in 2010   Patient Stated Goals get rid of pain, walk further, enjoy being active again    Currently in Pain? Yes   Pain Score 2    Pain Location Hip   Pain Orientation Left;Lateral   Pain Descriptors / Indicators Burning;Aching;Sore;Tender   Pain Type Chronic pain   Pain Onset More than a month ago   Pain Frequency Constant   Aggravating Factors  Laying on Lt side and gait   Pain Relieving Factors repositioning   Effect of Pain on Daily Activities Keeps on going                         [facility] Adult PT Treatment/Exercise - 09/27/15 0001      Lumbar Exercises: Stretches   Active Hamstring Stretch 30 seconds;2 reps   Active Hamstring Stretch Limitations supine with rope   Single Knee to Chest Stretch 5 reps   Lower Trunk Rotation 5 reps   ITB Stretch 2 reps;30 seconds   ITB Stretch Limitations Lt LE only supine with rope   Piriformis Stretch 3 reps;30 seconds   Piriformis Stretch Limitations Lt LE only supine figure 4     Lumbar Exercises: Supine   Bridge 10  reps   Other Supine Lumbar Exercises Supine ABD with RTB 10x each     Modalities   Modalities Moist Heat     Moist Heat Therapy   Number Minutes Moist Heat 10 Minutes   Moist Heat Location Other (comment)  Left Vastus Lateralis/Rectus Femoris during therex supine     Manual Therapy   Manual Therapy Soft tissue mobilization;Myofascial release   Soft tissue mobilization MFR Vastus Lateralis 62min   Myofascial Release Trigger Point Release to vastus laterus; TFL, glut med and ITB: 15 Minutes                  PT Short Term Goals - 09/15/15 1226      PT SHORT TERM GOAL #1   Title Patient to be able to maintain correct posture at least 75% of the time in order to assist in reducing pain and improving mechanics    Time 3   Period Weeks   Status  New     PT SHORT TERM GOAL #2   Title Patient to demonstrate bilateral hip ROM and all lumbar ROM as being [facility] with no exacerbation in pain in order to improve functional task performance    Time 3   Period Weeks   Status New     PT SHORT TERM GOAL #3   Title Pain to experience pain no more than 4/10 in order to improve functional task performance and tolerance, QOL    Time 3   Period Weeks   Status New     PT SHORT TERM GOAL #4   Title Patient to be able to correctly and consistently perform appropriate HEP, to be updated PRN    Time 3   Period Weeks   Status New           PT Long Term Goals - 09/15/15 1229      PT LONG TERM GOAL #1   Title Patient to demonstrate strength 5/5 in order to assist in reducing pain and improving functional task performance/endurance    Time 6   Period Weeks   Status New     PT LONG TERM GOAL #2   Title Patient to demonstrate correct mechanics for functional lifting in order to prevent exacerbation or re-occurrence of pain    Time 6   Period Weeks   Status New     PT LONG TERM GOAL #3   Title Patient to be able to perform all activities of her PLOF with pain no more than 2/10 to improve general QOL    Time 6   Period Weeks   Status New     PT LONG TERM GOAL #4   Title Patient to be participatory in regular aerobic exercise program, to be at least 20 minutes in duration, at least 4 days per week, in order to maintain functional gains and improve overall health status    Time 6   Period Weeks   Status New               Plan - 09/27/15 0914    Clinical Impression Statement Session focus on improving proximal musculature mobility and strengthening with addition of therex this session, pt able to demonstrate approproiate technique with min cueing for stability.  Continued with MHP to warm musculature during therex prior manual MFR and trigger point release to mid vastus lateralu, rectus femoris and TFL with reports of pain resolved  following manual.  EOS pt pain free with improved stance phase with  gait noted.  Additional exercises were given as HEP for gluteal strenghtening with ability to verbalize and demonstrate appropriate technique.   Rehab Potential Good   PT Frequency 2x / week   PT Duration 6 weeks   PT Treatment/Interventions ADLs/Self Care Home Management;Biofeedback;Cryotherapy;Moist Heat;Gait training;Stair training;Functional mobility training;Therapeutic activities;Therapeutic exercise;Balance training;Neuromuscular re-education;Patient/family education;Manual techniques;Passive range of motion;Energy conservation;Taping   PT Next Visit Plan Continue with current PT POC including proximal musculature strengthening, postural training, functional strengthening and manual therapy to lateral aspect Lt LE.   PT Home Exercise Plan EVAL 8/31: SKTC, lumbar rotation, hamstring stretch; 9/8 Supine ER stretch (L), Supine FADIR stretch (L), Prone Quads stretch + Adduction;  09/27/2015:  Addition with bridges and supine ABD with RTB.      Patient will benefit from skilled therapeutic intervention in order to improve the following deficits and impairments:  Abnormal gait, Impaired sensation, Improper body mechanics, Pain, Decreased coordination, Decreased mobility, Increased muscle spasms, Postural dysfunction, Hypomobility, Decreased strength, Difficulty walking, Impaired flexibility  Visit Diagnosis: Midline low back pain without sciatica  Abnormal posture  Muscle weakness (generalized)  Other symptoms and signs involving the musculoskeletal system     Problem List Patient Active Problem List   Diagnosis Date Noted  . Post-operative nausea and vomiting   . Encounter for screening colonoscopy 06/29/2015  . PONV (postoperative nausea and vomiting) 06/29/2015   Ihor Austin, LPTA; CBIS [phone]  Aldona Lento 09/27/2015, 9:37 AM  [place], [place], [address] Phone: [phone]   Fax:  [phone]  Name: Nichole Brewer MRN: [id number] Date of Birth: 06-17-1950

## 2015-09-30 ENCOUNTER — Ambulatory Visit (HOSPITAL_COMMUNITY): Payer: Medicare HMO

## 2015-09-30 DIAGNOSIS — M545 Low back pain, unspecified: Secondary | ICD-10-CM

## 2015-09-30 DIAGNOSIS — M6281 Muscle weakness (generalized): Secondary | ICD-10-CM

## 2015-09-30 DIAGNOSIS — R29898 Other symptoms and signs involving the musculoskeletal system: Secondary | ICD-10-CM

## 2015-09-30 DIAGNOSIS — R293 Abnormal posture: Secondary | ICD-10-CM

## 2015-09-30 NOTE — Therapy (Signed)
[facility] [place], [place], [address] Phone: [phone]   Fax:  [phone]  Physical Therapy Treatment  Patient Details  Name: Nichole Brewer MRN: [id number] Date of Birth: 1950/03/27 Referring Provider: Arther Abbott   Encounter Date: 09/30/2015      PT End of Session - 09/30/15 1042    Visit Number 4   Number of Visits 12   Date for PT Re-Evaluation 10/06/15   Authorization Type [employer] Medicare PPO    Authorization Time Period 09/15/15 to 10/27/15   Authorization - Visit Number 4   Authorization - Number of Visits 10   PT Start Time [id number]   PT Stop Time 1115   PT Time Calculation (min) 42 min   Activity Tolerance Patient tolerated treatment well;No increased pain   Behavior During Therapy WFL for tasks assessed/performed      Past Medical History:  Diagnosis Date  . Arthritis   . DJD (degenerative joint disease)   . Hypertension   . Osteoarthritis   . PONV (postoperative nausea and vomiting)    after colonoscopy    Past Surgical History:  Procedure Laterality Date  . CATARACT EXTRACTION W/PHACO Left 04/06/2013   Procedure: CATARACT EXTRACTION PHACO AND INTRAOCULAR LENS PLACEMENT (IOC);  Surgeon: Tonny Branch, MD;  Location: [facility];  Service: Ophthalmology;  Laterality: Left;  CDE 13.72  . CATARACT EXTRACTION W/PHACO Right 04/16/2013   Procedure: CATARACT EXTRACTION PHACO AND INTRAOCULAR LENS PLACEMENT (IOC);  Surgeon: Tonny Branch, MD;  Location: [facility];  Service: Ophthalmology;  Laterality: Right;  CDE:15.88  . CESAREAN SECTION  1986  . COLONOSCOPY N/A 07/22/2015   Procedure: COLONOSCOPY;  Surgeon: Daneil Dolin, MD;  [facility]: [facility];  Service: Endoscopy;  Laterality: N/A;  0830  . JOINT REPLACEMENT Right 2010   hip    There were no vitals filed for this visit.      Subjective Assessment - 09/30/15 1036    Subjective Pt stated she feels her walking is improving a lot as well as with stairs.   Continues to have discomfort sleeping at night unable to lay on Lt side and continues to have pain in Lt hip with Rt sidelying, does sleep with pillow between legs.  Received spinal injection on Tuesday, feels like it hasn't taken effects yet.   Pertinent History OA, DJD, R hip replacement (lateral approach per patient) in 2010   Patient Stated Goals get rid of pain, walk further, enjoy being active again    Currently in Pain? Yes   Pain Score 3    Pain Location Hip   Pain Orientation Lateral   Pain Descriptors / Indicators Burning;Aching;Tender   Pain Type Chronic pain   Pain Onset More than a month ago   Pain Frequency Constant   Aggravating Factors  Laying on Lt side and gait   Pain Relieving Factors repositioning   Effect of Pain on Daily Activities keeps on going                         [facility] Adult PT Treatment/Exercise - 09/30/15 0001      Lumbar Exercises: Stretches   Lower Trunk Rotation 5 reps;10 seconds   Piriformis Stretch 1 rep;30 seconds   Piriformis Stretch Limitations Lt LE only supine figure 4     Lumbar Exercises: Seated   Long Arc Quad on Chair Limitations   LAQ on Chair Weights (lbs) cervical retraction; Wback 15x 3"  Long Arc [employer] on [employer] Limitations   LAQ on [employer] with importance of proper posture   Sit to Stand Limitations   Sit to Stand Limitations Instructed lumbar towel roll      Lumbar Exercises: Supine   Ab Set 10 reps;3 seconds   AB Set Limitations [place] and verbal cueing for proper activation   Bridge 15 reps   Other Supine Lumbar Exercises Supine ABD with RTB 15x each     Manual Therapy   Manual Therapy Soft tissue mobilization;Myofascial release   Soft tissue mobilization MFR to vastus lateralis, TFL, ITB   Myofascial Release Trigger Point Release to vastus laterus; TFL, glut med and ITB                  PT Short Term Goals - 09/15/15 1226      PT SHORT TERM GOAL #1   Title Patient to be  able to maintain correct posture at least 75% of the time in order to assist in reducing pain and improving mechanics    Time 3   Period Weeks   Status New     PT SHORT TERM GOAL #2   Title Patient to demonstrate bilateral hip ROM and all lumbar ROM as being [facility] with no exacerbation in pain in order to improve functional task performance    Time 3   Period Weeks   Status New     PT SHORT TERM GOAL #3   Title Pain to experience pain no more than 4/10 in order to improve functional task performance and tolerance, QOL    Time 3   Period Weeks   Status New     PT SHORT TERM GOAL #4   Title Patient to be able to correctly and consistently perform appropriate HEP, to be updated PRN    Time 3   Period Weeks   Status New           PT Long Term Goals - 09/15/15 1229      PT LONG TERM GOAL #1   Title Patient to demonstrate strength 5/5 in order to assist in reducing pain and improving functional task performance/endurance    Time 6   Period Weeks   Status New     PT LONG TERM GOAL #2   Title Patient to demonstrate correct mechanics for functional lifting in order to prevent exacerbation or re-occurrence of pain    Time 6   Period Weeks   Status New     PT LONG TERM GOAL #3   Title Patient to be able to perform all activities of her PLOF with pain no more than 2/10 to improve general QOL    Time 6   Period Weeks   Status New     PT LONG TERM GOAL #4   Title Patient to be participatory in regular aerobic exercise program, to be at least 20 minutes in duration, at least 4 days per week, in order to maintain functional gains and improve overall health status    Time 6   Period Weeks   Status New               Plan - 09/30/15 1116    Clinical Impression Statement Reviewed form and technqiue with additional exercises given last session, pt able to demonstrate appropriate form and technqiue with exercises without cueing required.  Pt educated on importance of proper  posture to assist with pain control, added seated posture strengthening exercises.  Instructed lumbar towel roll to complete in seated position to assist with posture.  Ended session with manaul techniques to address trigger point and overall tightness lateral aspect Lt hip.  No reports of increased pain throuigh session.     Rehab Potential Good   PT Frequency 2x / week   PT Duration 6 weeks   PT Treatment/Interventions ADLs/Self Care Home Management;Biofeedback;Cryotherapy;Moist Heat;Gait training;Stair training;Functional mobility training;Therapeutic activities;Therapeutic exercise;Balance training;Neuromuscular re-education;Patient/family education;Manual techniques;Passive range of motion;Energy conservation;Taping   PT Next Visit Plan Continue with current PT POC including proximal musculature strengthening, postural training, functional strengthening and manual therapy to lateral aspect Lt LE.   PT Home Exercise Plan EVAL 8/31: SKTC, lumbar rotation, hamstring stretch; 9/8 Supine ER stretch (L), Supine FADIR stretch (L), Prone Quads stretch + Adduction;  09/27/2015:  Addition with bridges and supine ABD with RTB.      Patient will benefit from skilled therapeutic intervention in order to improve the following deficits and impairments:  Abnormal gait, Impaired sensation, Improper body mechanics, Pain, Decreased coordination, Decreased mobility, Increased muscle spasms, Postural dysfunction, Hypomobility, Decreased strength, Difficulty walking, Impaired flexibility  Visit Diagnosis: Midline low back pain without sciatica  Abnormal posture  Muscle weakness (generalized)  Other symptoms and signs involving the musculoskeletal system     Problem List Patient Active Problem List   Diagnosis Date Noted  . Post-operative nausea and vomiting   . Encounter for screening colonoscopy 06/29/2015  . PONV (postoperative nausea and vomiting) 06/29/2015   Ihor Austin, LPTA;  CBIS [phone]  Aldona Lento 09/30/2015, 1:01 PM  [facility] [address] [place], [place], [address] Phone: [phone]   Fax:  [phone]  Name: CHARDONAE OLLER MRN: [id number] Date of Birth: [birth year]

## 2015-10-12 ENCOUNTER — Ambulatory Visit (HOSPITAL_COMMUNITY): Payer: Medicare HMO | Admitting: Physical Therapy

## 2015-10-12 DIAGNOSIS — M545 Low back pain, unspecified: Secondary | ICD-10-CM

## 2015-10-12 DIAGNOSIS — M6281 Muscle weakness (generalized): Secondary | ICD-10-CM

## 2015-10-12 DIAGNOSIS — R293 Abnormal posture: Secondary | ICD-10-CM

## 2015-10-12 DIAGNOSIS — R29898 Other symptoms and signs involving the musculoskeletal system: Secondary | ICD-10-CM

## 2015-10-12 NOTE — Therapy (Signed)
[facility] [address] [place], [place], [address] Phone: [phone]   Fax:  [phone]  Physical Therapy Evaluation  Patient Details  Name: Nichole Brewer MRN: [id number] Date of Birth: 08-Aug-1950 Referring Provider: Arther Abbott   Encounter Date: 10/12/2015      PT End of Session - 10/12/15 0908    Visit Number 5   Number of Visits 5   Date for PT Re-Evaluation 10/06/15   Authorization Type [employer] Medicare PPO    Authorization Time Period 09/15/15 to 10/27/15   Authorization - Visit Number 5   Authorization - Number of Visits 5   PT Start Time 0820   PT Stop Time 0903   PT Time Calculation (min) 43 min   Activity Tolerance Patient tolerated treatment well;No increased pain   Behavior During Therapy WFL for tasks assessed/performed      Past Medical History:  Diagnosis Date  . Arthritis   . DJD (degenerative joint disease)   . Hypertension   . Osteoarthritis   . PONV (postoperative nausea and vomiting)    after colonoscopy    Past Surgical History:  Procedure Laterality Date  . CATARACT EXTRACTION W/PHACO Left 04/06/2013   Procedure: CATARACT EXTRACTION PHACO AND INTRAOCULAR LENS PLACEMENT (IOC);  Surgeon: Tonny Branch, MD;  Location: [facility];  Service: Ophthalmology;  Laterality: Left;  CDE 13.72  . CATARACT EXTRACTION W/PHACO Right 04/16/2013   Procedure: CATARACT EXTRACTION PHACO AND INTRAOCULAR LENS PLACEMENT (IOC);  Surgeon: Tonny Branch, MD;  Location: [facility];  Service: Ophthalmology;  Laterality: Right;  CDE:15.88  . CESAREAN SECTION  1986  . COLONOSCOPY N/A 07/22/2015   Procedure: COLONOSCOPY;  Surgeon: Daneil Dolin, MD;  [facility];  Service: Endoscopy;  Laterality: N/A;  0830  . JOINT REPLACEMENT Right 2010   hip    There were no vitals filed for this visit.       Subjective Assessment - 10/12/15 0820    Subjective Pt states that she still has pain on the Lt lateral hip area and  occasionally on the Right.  She states that she is about 50% better.    Pertinent History OA, DJD, R hip replacement (lateral approach per patient) in 2010   How long can you sit comfortably? unlimited   How long can you stand comfortably? 45 minutes was 30 minutes   How long can you walk comfortably? immediate discomfort    Diagnostic tests See MRI from October 2015 (most recent)   Patient Stated Goals get rid of pain, walk further, enjoy being active again    Currently in Pain? No/denies   Pain Onset More than a month ago            [facility] PT Assessment - 10/12/15 0001      Assessment   Medical Diagnosis spinal stenosis    Referring Provider Arther Abbott    Onset Date/Surgical Date --  chronic but really got worse spring 2017   Next MD Visit none scheduled, only if needed      Prior Function   Level of Independence Independent;Independent with basic ADLs;Independent with gait;Independent with transfers   Vocation Part time employment   Vocation Requirements GED program, lots of sitting    Leisure reading, crochet, caring for grandkids      Observation/Other Assessments   Observations scour (-) L, FABER (-) L, SLR (-) L; DNT R due to presence of prosthetic hip/reduced likelyhood of hip involvement on this side  Focus on Therapeutic Outcomes (FOTO)  17% limitied      AROM   Lumbar Flexion approximately 6 inches from floor    Lumbar Extension moderate limitation, hypomobile lumbar area, hip extension compensation     Lumbar - Right Side Bend fingertips approx 1 inch past midline of knee joint    Lumbar - Left Side Bend fingertips just above midline of knee joint      Strength   Right Hip Flexion 5/5   Right Hip Extension 3/5  was 3/5    Right Hip ABduction 4+/5  was 4-/5   Left Hip Flexion 5/5  was 3/5    Left Hip Extension 4/5  was 3/5    Left Hip ABduction 4/5  was 2/5    Right Knee Flexion 4/5  was 4/5    Right Knee Extension 5/5  was 4+/5   Left Knee  Flexion 4/5  was 4/5    Left Knee Extension 5/5  was 4+/5    Right Ankle Dorsiflexion 5/5  was 4+/5   Left Ankle Dorsiflexion 5/5  was 4+/5      Flexibility   Hamstrings moderate limitation L    Piriformis moderate limitation L      Palpation   Palpation comment able to reproduce lateral hip pain with manual palpation of lateral L hip/distal glutes/piriformis/belly of TFL and ITB on L; noted muscle tension B paraspinals; hypomobilty noted with cautious PAs lumbar and lower thoracic spines, sacrum      Ambulation/Gait   Gait Comments favoring of L LE with reduced stance time L/step length R; pronation B; flexed at hips; proximal weakness      6 minute walk test results    Aerobic Endurance Distance Walked 722  was 663   Endurance additional comments 3MWT      Timed Up and Go Test   Normal TUG (seconds) 7.7  was 10.6                    [facility] Adult PT Treatment/Exercise - 10/12/15 0001      Lumbar Exercises: Supine   Other Supine Lumbar Exercises decompression exercises 1-3; abdminal star exercises x 5 reps                 PT Education - 10/12/15 0907    Education provided Yes   Education Details decompression exercises; body mechnic education on lifting, pushing vacuum and sit to supine   Person(s) Educated Patient   Methods Explanation;Demonstration;Verbal cues;Handout   Comprehension Verbalized understanding;Returned demonstration          PT Short Term Goals - 10/12/15 0856      PT SHORT TERM GOAL #1   Title Patient to be able to maintain correct posture at least 75% of the time in order to assist in reducing pain and improving mechanics    Time 3   Period Weeks   Status Achieved     PT SHORT TERM GOAL #2   Title Patient to demonstrate bilateral hip ROM and all lumbar ROM as being [facility] with no exacerbation in pain in order to improve functional task performance    Time 3   Period Weeks   Status Achieved     PT SHORT TERM GOAL #3   Title  Pain to experience pain no more than 4/10 in order to improve functional task performance and tolerance, QOL    Time 3   Period Weeks   Status Achieved  PT SHORT TERM GOAL #4   Title Patient to be able to correctly and consistently perform appropriate HEP, to be updated PRN    Time 3   Period Weeks   Status Achieved           PT Long Term Goals - 10/18/15 0857      PT LONG TERM GOAL #1   Title Patient to demonstrate strength 5/5 in order to assist in reducing pain and improving functional task performance/endurance    Time 6   Period Weeks   Status Partially Met     PT LONG TERM GOAL #2   Title Patient to demonstrate correct mechanics for functional lifting in order to prevent exacerbation or re-occurrence of pain    Time 6   Period Weeks   Status Achieved     PT LONG TERM GOAL #3   Title Patient to be able to perform all activities of her PLOF with pain no more than 2/10 to improve general QOL    Time 6   Period Weeks   Status Achieved     PT LONG TERM GOAL #4   Title Patient to be participatory in regular aerobic exercise program, to be at least 20 minutes in duration, at least 4 days per week, in order to maintain functional gains and improve overall health status    Time 6   Period Weeks   Status Partially Met               Plan - 10/18/15 0908    Clinical Impression Statement Pt reevaluated with significant improvement.  Pt educated on body mechanics for lifting; bed mobility and pushing.  Pt is pleased with results and request to be discharge even though she has not finished the program and still has radicular symptoms.  Pt feels that she can continue on own at home therefore will discharge pt.    Rehab Potential Good   PT Frequency 2x / week   PT Duration 6 weeks   PT Treatment/Interventions ADLs/Self Care Home Management;Biofeedback;Cryotherapy;Moist Heat;Gait training;Stair training;Functional mobility training;Therapeutic activities;Therapeutic  exercise;Balance training;Neuromuscular re-education;Patient/family education;Manual techniques;Passive range of motion;Energy conservation;Taping   PT Next Visit Plan discharge    PT Home Exercise Plan EVAL 8/31: SKTC, lumbar rotation, hamstring stretch; 9/8 Supine ER stretch (L), Supine FADIR stretch (L), Prone Quads stretch + Adduction;  09/27/2015:  Addition with bridges and supine ABD with RTB.      Patient will benefit from skilled therapeutic intervention in order to improve the following deficits and impairments:  Abnormal gait, Impaired sensation, Improper body mechanics, Pain, Decreased coordination, Decreased mobility, Increased muscle spasms, Postural dysfunction, Hypomobility, Decreased strength, Difficulty walking, Impaired flexibility  Visit Diagnosis: Midline low back pain without sciatica  Abnormal posture  Muscle weakness (generalized)  Other symptoms and signs involving the musculoskeletal system      G-Codes - 10/18/2015 0912    Functional Assessment Tool Used foto   Functional Limitation Mobility: Walking and moving around   Mobility: Walking and Moving Around Goal Status [phone]) At least 20 percent but less than 40 percent impaired, limited or restricted   Mobility: Walking and Moving Around Discharge Status [phone]) At least 1 percent but less than 20 percent impaired, limited or restricted       Problem List Patient Active Problem List   Diagnosis Date Noted  . Post-operative nausea and vomiting   . Encounter for screening colonoscopy 06/29/2015  . PONV (postoperative nausea and vomiting) 06/29/2015    Caren Griffins  Joneen Caraway, [place] [phone] 10/12/2015, 9:13 AM  [place], [place], [address] Phone: [phone]   Fax:  [phone]  Name: Nichole Brewer MRN: [id number] Date of Birth: 1950-04-14  PHYSICAL THERAPY DISCHARGE SUMMARY  Visits from Start of Care: 5  Current functional  level related to goals / functional outcomes: As above   Remaining deficits: As above   Education / Equipment: HEP; body mechanics  Plan: Patient agrees to discharge.  Patient goals were partially met. Patient is being discharged due to being pleased with the current functional level.  ?????       Rayetta Humphrey, [place] CLT [phone]

## 2015-10-17 ENCOUNTER — Encounter (HOSPITAL_COMMUNITY): Payer: Medicare HMO | Admitting: Physical Therapy

## 2015-10-19 ENCOUNTER — Encounter (HOSPITAL_COMMUNITY): Payer: Medicare HMO | Admitting: Physical Therapy

## 2015-10-24 ENCOUNTER — Encounter (HOSPITAL_COMMUNITY): Payer: Medicare HMO | Admitting: Physical Therapy

## 2015-10-27 ENCOUNTER — Encounter (HOSPITAL_COMMUNITY): Payer: Medicare HMO | Admitting: Physical Therapy

## 2015-11-02 ENCOUNTER — Encounter (INDEPENDENT_AMBULATORY_CARE_PROVIDER_SITE_OTHER): Payer: Self-pay

## 2015-11-04 ENCOUNTER — Encounter: Payer: Self-pay | Admitting: Orthopedic Surgery

## 2015-11-04 ENCOUNTER — Ambulatory Visit (INDEPENDENT_AMBULATORY_CARE_PROVIDER_SITE_OTHER): Payer: Medicare HMO

## 2015-11-04 ENCOUNTER — Other Ambulatory Visit: Payer: Self-pay | Admitting: Orthopedic Surgery

## 2015-11-04 ENCOUNTER — Ambulatory Visit (INDEPENDENT_AMBULATORY_CARE_PROVIDER_SITE_OTHER): Payer: Medicare HMO | Admitting: Orthopedic Surgery

## 2015-11-04 DIAGNOSIS — M25552 Pain in left hip: Secondary | ICD-10-CM

## 2015-11-04 DIAGNOSIS — G8929 Other chronic pain: Secondary | ICD-10-CM

## 2015-11-04 DIAGNOSIS — M5442 Lumbago with sciatica, left side: Secondary | ICD-10-CM

## 2015-11-04 DIAGNOSIS — M48061 Spinal stenosis, lumbar region without neurogenic claudication: Secondary | ICD-10-CM | POA: Diagnosis not present

## 2015-11-04 MED ORDER — TIZANIDINE HCL 2 MG PO TABS
2.0000 mg | ORAL_TABLET | Freq: Four times a day (QID) | ORAL | 0 refills | Status: DC | PRN
Start: 2015-11-04 — End: 2015-11-04

## 2015-11-04 MED ORDER — PREDNISONE 10 MG (48) PO TBPK: ORAL_TABLET | Freq: Every day | ORAL | 0 refills | Status: DC

## 2015-11-04 MED ORDER — TRAMADOL-ACETAMINOPHEN 37.5-325 MG PO TABS: 1.0000 | ORAL_TABLET | ORAL | 5 refills | Status: DC | PRN

## 2015-11-04 NOTE — Patient Instructions (Signed)
Stop diclofenac

## 2015-11-04 NOTE — Progress Notes (Signed)
Patient ID: Nichole Brewer, female   DOB: 1950-02-25, 65 y.o.   MRN: [id number]  Chief Complaint  Patient presents with  . Follow-up    LEFT HIP PAIN    HPI Nichole Brewer is a 65 y.o. female.  Patient presents with increased pain in her left hip and left leg. She is undergoing epidural type injections at [facility]. I first saw her because a leg length discrepancy we recommended a shoe lift to balance her out. She comes in complaining of increasing pain left lower back and left leg with radiation into the left knee area.  I looked at her MRI from 2015 she has severe disc disease protruding in and herniated disks with nerve impingement and spinal stenosis  She says she had a really bad week she's having trouble walking she can't stand up straight.  I gave her some gabapentin for symptoms of spinal stenosis which she did not tolerate well.  She is asking for some help  Review of Systems Review of Systems  Constitutional: Positive for activity change. Negative for appetite change and chills.  Gastrointestinal: Negative.   Genitourinary: Negative.      Past Medical History:  Diagnosis Date  . Arthritis   . DJD (degenerative joint disease)   . Hypertension   . Osteoarthritis   . PONV (postoperative nausea and vomiting)    after colonoscopy    Past Surgical History:  Procedure Laterality Date  . CATARACT EXTRACTION W/PHACO Left 04/06/2013   Procedure: CATARACT EXTRACTION PHACO AND INTRAOCULAR LENS PLACEMENT (IOC);  Surgeon: Tonny Branch, MD;  [facility];  Service: Ophthalmology;  Laterality: Left;  CDE 13.72  . CATARACT EXTRACTION W/PHACO Right 04/16/2013   Procedure: CATARACT EXTRACTION PHACO AND INTRAOCULAR LENS PLACEMENT (IOC);  Surgeon: Tonny Branch, MD;  Location: [facility];  Service: Ophthalmology;  Laterality: Right;  CDE:15.88  . CESAREAN SECTION  1986  . COLONOSCOPY N/A 07/22/2015   Procedure: COLONOSCOPY;  Surgeon: Daneil Dolin, MD;  [facility];  Service: Endoscopy;  Laterality: N/A;  0830  . JOINT REPLACEMENT Right 2010   hip    Social History Social History  Substance Use Topics  . Smoking status: Never Smoker  . Smokeless tobacco: Never Used  . Alcohol use No    No Known Allergies  Current Meds  Medication Sig  . amLODipine (NORVASC) 5 MG tablet Take 5 mg by mouth daily.  . diclofenac (VOLTAREN) 75 MG EC tablet Take 75 mg by mouth daily as needed for mild pain or moderate pain.   Marland Kitchen gabapentin (NEURONTIN) 300 MG capsule Take 1 capsule (300 mg total) by mouth daily at 10 pm.  . methocarbamol (ROBAXIN) 500 MG tablet Take 500 mg by mouth every 6 (six) hours as needed for muscle spasms. Reported on 06/14/2015  . polyethylene glycol-electrolytes (NULYTELY/GOLYTELY) 420 g solution Take 4,000 mLs by mouth once.      Physical Exam Physical Exam There were no vitals taken for this visit.  Gen. appearance. The patient is well-developed and well-nourished, grooming and hygiene are normal. There are no gross congenital abnormalities  The patient is alert and oriented to person place and time  Mood and affect are normal  Ambulation sluggish ambulation she has mild flexion at the lumbopelvic junction  Examination reveals the following: On inspection we find tenderness in her lower back left buttock left SI joint left greater trochanter left thigh  With the range of motion of  in the left and right  hip normal  Stability tests were normal in both hips  Strength tests revealed grade 5 motor strength in both lower extremities  Skin we find no rash ulceration or erythema in either lower extremity  Sensation remains intact in both lower extremities  Impression vascular system shows no peripheral edema  Data Reviewed Prior MRI from 2015 was reviewed. That MRI showed IMPRESSION: The dominant RIGHT-sided abnormality contributing RIGHT groin numbness is the large disc extrusion at L1-L2 with associated free fragment  and/or chronic epidural hematoma. Severe RIGHT L2 nerve root impingement is observed.   Moderate to severe spinal stenosis at L4-5 is multifactorial, related to slip and posterior element hypertrophy.   Potentially symptomatic RIGHT-sided radicular symptoms could originate from foraminal narrowing at L5-S1.   Moderately large disc extrusion at L2-3, LEFT particularly in the extraforaminal compartment affecting the LEFT L2 and LEFT L3 nerve roots.     Electronically Signed   By: Rolla Flatten M.D.   On: 09/11/2013 20:59   I got a pelvic x-ray on her to check her left hip for arthritis she has mild arthritis no progression from previous x-ray shows right total hip prosthesis in good position and without loosening  Her lumbar spine films show severe disease today  Assessment    Encounter Diagnoses  Name Primary?  . Chronic left-sided low back pain with left-sided sciatica Yes  . Left hip pain   . Spinal stenosis, lumbar region, without neurogenic claudication        Plan    I did give her some medicines to temporize her situation. She has a appointment with the [place] [facility] for epidural injections. She had her last one about a month or so ago. We will try to get Dr. Lorin Mercy to evaluate her for possible surgical intervention  I did not arrange a follow-up       Arther Abbott 11/04/2015, 10:00 AM

## 2015-11-09 ENCOUNTER — Encounter (INDEPENDENT_AMBULATORY_CARE_PROVIDER_SITE_OTHER): Payer: Self-pay | Admitting: Physical Medicine and Rehabilitation

## 2015-11-09 ENCOUNTER — Ambulatory Visit (INDEPENDENT_AMBULATORY_CARE_PROVIDER_SITE_OTHER): Payer: Medicare HMO | Admitting: Physical Medicine and Rehabilitation

## 2015-11-09 VITALS — BP 159/96 | HR 108

## 2015-11-09 DIAGNOSIS — M5116 Intervertebral disc disorders with radiculopathy, lumbar region: Secondary | ICD-10-CM

## 2015-11-09 DIAGNOSIS — M5416 Radiculopathy, lumbar region: Secondary | ICD-10-CM | POA: Diagnosis not present

## 2015-11-09 DIAGNOSIS — M48062 Spinal stenosis, lumbar region with neurogenic claudication: Secondary | ICD-10-CM | POA: Diagnosis not present

## 2015-11-09 NOTE — Progress Notes (Signed)
Office Visit Note   Patient: Nichole Brewer           Date of Birth: [birth year]           MRN: [id number] Visit Date: 11/09/2015              Requested by: Asencion Noble, MD [address] [place], [place] [address] PCP: Asencion Noble, MD   Assessment & Plan: Visit Diagnoses:  1. Lumbar radiculopathy   2. Radiculopathy due to lumbar intervertebral disc disorder   3. Spinal stenosis of lumbar region with neurogenic claudication     Plan: Chronic worsening left hip and thigh pain which is somewhat more consistent with a stenosis and lumbar disc herniation on the left at about the L2-L3 level which she did have only older MRI. Last injection performed was actually a lower level. I think it would be wise to complete a Gen. and a lower epidural injection higher up at around L2-today just to see if she gets some relief diagnostically. This would also help Dr. Lorin Mercy figure things out in terms of her pain level. I do think she is getting some pain from the hip joint itself. She does have pain on rotation but I don't think this is really the main issue at this point. She doesn't really have much in way of bursitis. We will schedule her for the injection. She is to continue her prednisone taper. Depending on how things go were probably need to get an updated MRI.  Follow-Up Instructions: Return for schedule Left L2-2 interlam.   Orders:  No orders of the defined types were placed in this encounter.  No orders of the defined types were placed in this encounter.     Procedures: No procedures performed   Clinical Data: No additional findings.   Subjective: Chief Complaint  Patient presents with  . Left Hip - Pain    Hip Pain   There was no injury mechanism. The pain is present in the left leg, left hip, left thigh and left knee. The quality of the pain is described as aching. The pain is at a severity of 8/10. Associated symptoms include numbness. The symptoms are aggravated by  movement. She has tried rest for the symptoms. The treatment provided mild relief.  Left hip pain. The pain radiates to about left knee. It is worse with movement and improves with sitting very still. She has not been able to work for a week and a half. Saw Dr. Aline Brochure on Friday who said he thought this pain was coming from her back. He started her on Prednisone and the pain has gotten a little better.I did review those x-rays today and it did show some mild arthritis on the left. She does have a prior right total hip arthroplasty. She's not had any pain radiating past the knee. She hurts on the left side really about the area of the sacroiliac joint. Prior MRI completed his Gaucher pretty significant disc herniation extrusion on the right with moderate left. She does have an appointment coming up with Dr. Lorin Mercy. We actually wanted her to see him as an opinion from a surgical standpoint. She also does have stenosis of the lumbar spine as well. She is also been somewhat intolerant of medications in the past that are somewhat psychoactive. She's not do well with gabapentin. She is actually not do too well with muscle relaxants or pain medications.  Review of Systems  Constitutional: Negative for chills, fatigue, fever and unexpected  weight change.  HENT: Negative for sore throat and trouble swallowing.   Eyes: Negative for photophobia and visual disturbance.  Respiratory: Negative for chest tightness and shortness of breath.   Cardiovascular: Negative for chest pain.  Gastrointestinal: Negative for abdominal pain.  Endocrine: Negative for cold intolerance and heat intolerance.  Musculoskeletal: Negative for myalgias.  Skin: Negative for color change and rash.  Neurological: Positive for numbness. Negative for speech difficulty and headaches.  Psychiatric/Behavioral: Negative for confusion. The patient is not nervous/anxious.   All other systems reviewed and are negative.    Objective: Vital Signs:  BP (!) 159/96   Pulse (!) 108   Physical Exam  Constitutional: She appears well-developed and well-nourished. No distress.  Eyes: Conjunctivae are normal. Pupils are equal, round, and reactive to light.  Cardiovascular: Regular rhythm and intact distal pulses.   Pulmonary/Chest: Effort normal and breath sounds normal.  Skin: Skin is warm.  Psychiatric: She has a normal mood and affect.  Nursing note and vitals reviewed.   Ortho Exam The patient ambulates without aid with a normal gait but with forward flexed spine.  They have some groin pain on the left with internal rotation.  There is pain with extension rotation of the lumbar spine. They have no pain over the greater trochanters or PSIS.  On manual muscle testing there is 5/5 strength in all muscle groups of the lower extremities bilaterally without deficits.    There is no clonus bilaterally.  Slump test is negative bilaterally.  Specialty Comments:  No specialty comments available.  Imaging: No results found.   PMFS History: Patient Active Problem List   Diagnosis Date Noted  . Post-operative nausea and vomiting   . Encounter for screening colonoscopy 06/29/2015  . PONV (postoperative nausea and vomiting) 06/29/2015   Past Medical History:  Diagnosis Date  . Arthritis   . DJD (degenerative joint disease)   . Hypertension   . Osteoarthritis   . PONV (postoperative nausea and vomiting)    after colonoscopy    Family History  Problem Relation Age of Onset  . Colon cancer Neg Hx     Past Surgical History:  Procedure Laterality Date  . CATARACT EXTRACTION W/PHACO Left 04/06/2013   Procedure: CATARACT EXTRACTION PHACO AND INTRAOCULAR LENS PLACEMENT (IOC);  Surgeon: Tonny Branch, MD;  Location: [facility];  Service: Ophthalmology;  Laterality: Left;  CDE 13.72  . CATARACT EXTRACTION W/PHACO Right 04/16/2013   Procedure: CATARACT EXTRACTION PHACO AND INTRAOCULAR LENS PLACEMENT (IOC);  Surgeon: Tonny Branch, MD;  Location: [facility];   Service: Ophthalmology;  Laterality: Right;  CDE:15.88  . CESAREAN SECTION  1986  . COLONOSCOPY N/A 07/22/2015   Procedure: COLONOSCOPY;  Surgeon: Daneil Dolin, MD;  Location: [facility];  Service: Endoscopy;  Laterality: N/A;  0830  . JOINT REPLACEMENT Right 2010   hip   Social History   Occupational History  . Not on file.   Social History Main Topics  . Smoking status: Never Smoker  . Smokeless tobacco: Never Used  . Alcohol use No  . Drug use: No  . Sexual activity: Not on file

## 2015-11-14 DIAGNOSIS — I10 Essential (primary) hypertension: Secondary | ICD-10-CM | POA: Diagnosis not present

## 2015-11-14 DIAGNOSIS — K219 Gastro-esophageal reflux disease without esophagitis: Secondary | ICD-10-CM | POA: Diagnosis not present

## 2015-11-14 DIAGNOSIS — E785 Hyperlipidemia, unspecified: Secondary | ICD-10-CM | POA: Diagnosis not present

## 2015-11-14 DIAGNOSIS — M1990 Unspecified osteoarthritis, unspecified site: Secondary | ICD-10-CM | POA: Diagnosis not present

## 2015-11-14 DIAGNOSIS — Z79899 Other long term (current) drug therapy: Secondary | ICD-10-CM | POA: Diagnosis not present

## 2015-11-16 ENCOUNTER — Telehealth (INDEPENDENT_AMBULATORY_CARE_PROVIDER_SITE_OTHER): Payer: Self-pay

## 2015-11-16 ENCOUNTER — Telehealth (INDEPENDENT_AMBULATORY_CARE_PROVIDER_SITE_OTHER): Payer: Self-pay | Admitting: Physical Medicine and Rehabilitation

## 2015-11-16 NOTE — Telephone Encounter (Signed)
Injection auto approved on [employer] website for [phone]. Auth # [id number].

## 2015-11-17 ENCOUNTER — Encounter (INDEPENDENT_AMBULATORY_CARE_PROVIDER_SITE_OTHER): Payer: Self-pay | Admitting: Physical Medicine and Rehabilitation

## 2015-11-17 ENCOUNTER — Ambulatory Visit (INDEPENDENT_AMBULATORY_CARE_PROVIDER_SITE_OTHER): Payer: Medicare HMO | Admitting: Physical Medicine and Rehabilitation

## 2015-11-17 VITALS — BP 132/79 | HR 102 | Temp 98.5°F

## 2015-11-17 DIAGNOSIS — M48062 Spinal stenosis, lumbar region with neurogenic claudication: Secondary | ICD-10-CM

## 2015-11-17 DIAGNOSIS — M5416 Radiculopathy, lumbar region: Secondary | ICD-10-CM

## 2015-11-17 MED ORDER — LIDOCAINE HCL (PF) 1 % IJ SOLN
0.3300 mL | Freq: Once | INTRAMUSCULAR | Status: AC
  Administered 2015-11-17: 0.3 mL

## 2015-11-17 MED ORDER — METHYLPREDNISOLONE ACETATE 80 MG/ML IJ SUSP
80.0000 mg | Freq: Once | INTRAMUSCULAR | Status: AC
  Administered 2015-11-17: 80 mg

## 2015-11-17 NOTE — Progress Notes (Signed)
Office Visit Note  Patient: Nichole Brewer           Date of Birth: 09-13-1950           MRN: [id number] Visit Date: 11/17/2015              Requested by: Asencion Noble, MD [address] [place], [place] [address] PCP: Asencion Noble, MD   Assessment & Plan: Visit Diagnoses:  1. Spinal stenosis of lumbar region with neurogenic claudication   2. Lumbar radiculopathy     Follow-Up Instructions: Return if symptoms worsen or fail to improve with Dr. Lorin Mercy.  Orders:  Orders Placed This Encounter  Procedures  . Epidural Steroid injection    Meds ordered this encounter  Medications  . lidocaine (PF) (XYLOCAINE) 1 % injection 0.3 mL  . methylPREDNISolone acetate (DEPO-MEDROL) injection 80 mg      Procedures: Lumbar Epidural Steroid Injection - Interlaminar Approach with Fluoroscopic Guidance  Patient: Nichole Brewer      Date of Birth: 01/28/1950 MRN: [id number] PCP: Asencion Noble, MD      Visit Date: 11/17/2015   Universal Protocol:    Date/Time: 11/02/172:43 PM  Consent Given By: the patient  Position: PRONE  Additional Comments: Vital signs were monitored before and after the procedure. Patient was prepped and draped in the usual sterile fashion. The correct patient, procedure, and site was verified.   Injection Procedure Details:  Procedure Site One Meds Administered:  Meds ordered this encounter  Medications  . lidocaine (PF) (XYLOCAINE) 1 % injection 0.3 mL  . methylPREDNISolone acetate (DEPO-MEDROL) injection 80 mg     Laterality: Left  Location/Site:  L2-L3  Needle size: 20 G  Needle type: Tuohy  Needle Placement: Paramedian epidural  Findings:  -Contrast Used: 2 mL iohexol 180 mg iodine/mL   -Comments: Excellent flow of contrast into the epidural space.  Procedure Details: Using a paramedian approach from the side mentioned above, the region overlying the inferior lamina was localized under fluoroscopic visualization and the soft  tissues overlying this structure were infiltrated with 4 ml. of 1% Lidocaine without Epinephrine. The Tuohy needle was inserted into the epidural space using a paramedian approach.   The epidural space was localized using loss of resistance along with lateral and bi-planar fluoroscopic views.  After negative aspirate for air, blood, and CSF, a 2 ml. volume of Isovue-250 was injected into the epidural space and the flow of contrast was observed. Radiographs were obtained for documentation purposes.    The injectate was administered into the level noted above.   Additional Comments:  The patient tolerated the procedure well Dressing: Band-Aid    Post-procedure details: Patient was observed during the procedure. Post-procedure instructions were reviewed.  Patient left the clinic in stable condition.    Other Procedures: No procedures performed   Clinical Data: No additional findings.   Subjective: Chief Complaint  Patient presents with  . Lower Back - Pain    Spine injection    HPI Nichole Brewer is a 65 year old female with known lumbar stenosis. Prior epidural injections have been beneficial but recently they just weren't as effective. We saw her recently in follow-up and she had seen Dr. Aline Brochure as well. We are going to complete a diagnostic and therapeutic left L2-S1 intralaminar epidural steroid injection. She is follow-up will be with Dr. Lorin Mercy scheduled to discuss potential surgery and other evaluation efforts., pain scale 7/10  No allergies to dye  Has driver  Review of Systems  Objective: Vital Signs: BP 132/79 (BP Location: Left Arm, Patient Position: Sitting)   Pulse (!) 102   Temp 98.5 F (36.9 C) (Oral)    Physical Exam General appearance: NAD, conversant  Psych: Appropriate affect, alert and oriented to person, place and time  Eyes: anicteric sclerae, moist conjunctivae; no lid-lag; PERRLA Lungs: normal respiratory effort and no intercostal  retractions, no wheezing CVA: normal pulses Extremities: No peripheral edema  Skin: Normal temperature, turgor and texture; no rash, ulcers or subcutaneous nodules MSK:/Neuro:   On manual muscle testing there is 5/5 strength in the distal muscle groups of the lower extremities bilaterally without deficits. There is no clonus test bilaterally.   Ortho Exam  Specialty Comments:  No specialty comments available. Imaging: No results found.   PMFS History: Patient Active Problem List   Diagnosis Date Noted  . Post-operative nausea and vomiting   . Encounter for screening colonoscopy 06/29/2015  . PONV (postoperative nausea and vomiting) 06/29/2015   Past Medical History:  Diagnosis Date  . Arthritis   . DJD (degenerative joint disease)   . Hypertension   . Osteoarthritis   . PONV (postoperative nausea and vomiting)    after colonoscopy    Family History  Problem Relation Age of Onset  . Colon cancer Neg Hx    Past Surgical History:  Procedure Laterality Date  . CATARACT EXTRACTION W/PHACO Left 04/06/2013   Procedure: CATARACT EXTRACTION PHACO AND INTRAOCULAR LENS PLACEMENT (IOC);  Surgeon: Tonny Branch, MD;  Location: [facility];  Service: Ophthalmology;  Laterality: Left;  CDE 13.72  . CATARACT EXTRACTION W/PHACO Right 04/16/2013   Procedure: CATARACT EXTRACTION PHACO AND INTRAOCULAR LENS PLACEMENT (IOC);  Surgeon: Tonny Branch, MD;  Location: [facility];  Service: Ophthalmology;  Laterality: Right;  CDE:15.88  . CESAREAN SECTION  1986  . COLONOSCOPY N/A 07/22/2015   Procedure: COLONOSCOPY;  Surgeon: Daneil Dolin, MD;  Location: [facility];  Service: Endoscopy;  Laterality: N/A;  0830  . JOINT REPLACEMENT Right 2010   hip   Social History   Occupational History  . Not on file.   Social History Main Topics  . Smoking status: Never Smoker  . Smokeless tobacco: Never Used  . Alcohol use No  . Drug use: No  . Sexual activity: Not on file

## 2015-11-17 NOTE — Telephone Encounter (Signed)
Patient is scheduled for 11/17/15 at 230.

## 2015-11-17 NOTE — Patient Instructions (Signed)

## 2015-11-17 NOTE — Procedures (Signed)
Lumbar Epidural Steroid Injection - Interlaminar Approach with Fluoroscopic Guidance  Patient: Nichole Brewer      Date of Birth: 1950/12/19 MRN: [id number] PCP: Asencion Noble, MD      Visit Date: 11/17/2015   Universal Protocol:    Date/Time: 11/02/172:43 PM  Consent Given By: the patient  Position: PRONE  Additional Comments: Vital signs were monitored before and after the procedure. Patient was prepped and draped in the usual sterile fashion. The correct patient, procedure, and site was verified.   Injection Procedure Details:  Procedure Site One Meds Administered:  Meds ordered this encounter  Medications  . lidocaine (PF) (XYLOCAINE) 1 % injection 0.3 mL  . methylPREDNISolone acetate (DEPO-MEDROL) injection 80 mg     Laterality: Left  Location/Site:  L2-L3  Needle size: 20 G  Needle type: Tuohy  Needle Placement: Paramedian epidural  Findings:  -Contrast Used: 2 mL iohexol 180 mg iodine/mL   -Comments: Excellent flow of contrast into the epidural space.  Procedure Details: Using a paramedian approach from the side mentioned above, the region overlying the inferior lamina was localized under fluoroscopic visualization and the soft tissues overlying this structure were infiltrated with 4 ml. of 1% Lidocaine without Epinephrine. The Tuohy needle was inserted into the epidural space using a paramedian approach.   The epidural space was localized using loss of resistance along with lateral and bi-planar fluoroscopic views.  After negative aspirate for air, blood, and CSF, a 2 ml. volume of Isovue-250 was injected into the epidural space and the flow of contrast was observed. Radiographs were obtained for documentation purposes.    The injectate was administered into the level noted above.   Additional Comments:  The patient tolerated the procedure well Dressing: Band-Aid    Post-procedure details: Patient was observed during the procedure. Post-procedure  instructions were reviewed.  Patient left the clinic in stable condition.

## 2015-11-22 DIAGNOSIS — R7301 Impaired fasting glucose: Secondary | ICD-10-CM | POA: Diagnosis not present

## 2015-11-22 DIAGNOSIS — Z23 Encounter for immunization: Secondary | ICD-10-CM | POA: Diagnosis not present

## 2015-11-22 DIAGNOSIS — I10 Essential (primary) hypertension: Secondary | ICD-10-CM | POA: Diagnosis not present

## 2015-11-30 ENCOUNTER — Encounter (INDEPENDENT_AMBULATORY_CARE_PROVIDER_SITE_OTHER): Payer: Self-pay | Admitting: Orthopaedic Surgery

## 2015-11-30 ENCOUNTER — Ambulatory Visit (INDEPENDENT_AMBULATORY_CARE_PROVIDER_SITE_OTHER): Payer: Medicare HMO | Admitting: Orthopaedic Surgery

## 2015-11-30 VITALS — BP 151/90 | HR 86 | Ht 63.0 in | Wt 187.0 lb

## 2015-11-30 DIAGNOSIS — M48062 Spinal stenosis, lumbar region with neurogenic claudication: Secondary | ICD-10-CM

## 2015-11-30 NOTE — Progress Notes (Signed)
Office Visit Note   Patient: Nichole Brewer           Date of Birth: 23-Jun-1950           MRN: [id number] Visit Date: 11/30/2015              Requested by: Asencion Noble, MD [address] [place], [place] [address] PCP: Asencion Noble, MD   Assessment & Plan: Visit Diagnoses:  1. Spinal stenosis of lumbar region with neurogenic claudication   2.      Previous left total hip arthroplasty doing well  Plan: We will proceed with the new lumbar MRI to evaluate the progression of spinal stenosis at the L4-5 level. Will also violate any residual disc herniation that was present on previous scan at the L1-2 level. Office follow up after MRI. Discussion about spinal stenosis was held to multiple questions were answered their rasped with patient as well as her daughter. Once her MRI is reviewed with them then we can outline treatment plan and discussed the the possibility of surgery possible length of time out of work etc.  Follow-Up Instructions: No Follow-up on file.   Orders:  No orders of the defined types were placed in this encounter.  No orders of the defined types were placed in this encounter.     Procedures: No procedures performed   Clinical Data: No additional findings.   Subjective: Chief Complaint  Patient presents with  . Lower Back - Pain    Patient has been referred by Dr. Arther Abbott for low back pain that radiates into left leg.  She woke up one morning with what she described as a "pull" in her left leg. She called and scheduled appt with Dr. Ernestina Patches and had an injection about 2 weeks ago. This injection has helped. She states that she could not function at all prior to injection. She still has pain but can function now. She is not taking anything for pain. She did have xrays in made of her back in Dr. Ruthe Mannan office.  Patient has a past history of large L1 to right disc extrusion which improved with injections. At that time it was noted that she had  severe spinal stenosis at L4-5 with degenerative anterolisthesis of 4 mm. Patient's had progressive pain with standing she can only walk 1/4-1/2 block and then has to sit. She has problems when she stands she does better when she leans over a grocery cart she gets relief with sitting no pain when she is supine. Patient's daughter is present with her in the provided additional history. Symptoms have progressed over the last 6 months.  Review of Systems  Constitutional: Negative for chills and diaphoresis.  HENT: Negative for ear discharge, ear pain and nosebleeds.   Eyes: Negative for discharge and visual disturbance.  Respiratory: Negative for cough, choking and shortness of breath.   Cardiovascular: Negative for chest pain and palpitations.  Gastrointestinal: Negative for abdominal distention and abdominal pain.  Endocrine: Negative for cold intolerance and heat intolerance.  Genitourinary: Negative for flank pain and hematuria.  Skin: Negative for rash and wound.  Neurological: Negative for seizures and speech difficulty.  Hematological: Negative for adenopathy. Does not bruise/bleed easily.  Psychiatric/Behavioral: Negative for agitation and suicidal ideas.   History of nausea and vomiting after colonoscopy. Previous left hip arthroplasty doing well. Previous C-section.  Objective: Vital Signs: BP (!) 151/90   Pulse 86   Ht 5\' 3"  (1.6 m)   Wt 187 lb (84.8  kg)   BMI 33.13 kg/m   Physical Exam  Constitutional: She is oriented to person, place, and time. She appears well-developed.  HENT:  Head: Normocephalic.  Right Ear: External ear normal.  Left Ear: External ear normal.  Eyes: Pupils are equal, round, and reactive to light.  Neck: No tracheal deviation present. No thyromegaly present.  Cardiovascular: Normal rate.   Pulmonary/Chest: Effort normal.  Abdominal: Soft.  Musculoskeletal:  Healed left total hip arthroplasty incision. Pelvis is level. There is lumbar scoliosis. No  pain with right hip range of motion no pain with internal/external rotation left hip. She is a mature with bilateral Trendelenburg gait.  Neurological: She is alert and oriented to person, place, and time.  Skin: Skin is warm and dry.  Psychiatric: She has a normal mood and affect. Her behavior is normal.    Ortho Exam knee jerk is 2+ ankle jerk on the left is absent it's 2+ on the right. Heel toe gait with Trendelenburg gait bilateral. Weakness with abductors. Trace anterior tib weakness right and left. EHL also shows trach with trace weakness. Distal pulses are present no pitting edema. Both these reach full extension.  Specialty Comments:  No specialty comments available.  Imaging: No results found.   PMFS History: Patient Active Problem List   Diagnosis Date Noted  . Post-operative nausea and vomiting   . Encounter for screening colonoscopy 06/29/2015  . PONV (postoperative nausea and vomiting) 06/29/2015   Past Medical History:  Diagnosis Date  . Arthritis   . DJD (degenerative joint disease)   . Hypertension   . Osteoarthritis   . PONV (postoperative nausea and vomiting)    after colonoscopy    Family History  Problem Relation Age of Onset  . Colon cancer Neg Hx     Past Surgical History:  Procedure Laterality Date  . CATARACT EXTRACTION W/PHACO Left 04/06/2013   Procedure: CATARACT EXTRACTION PHACO AND INTRAOCULAR LENS PLACEMENT (IOC);  Surgeon: Tonny Branch, MD;  Location: [facility];  Service: Ophthalmology;  Laterality: Left;  CDE 13.72  . CATARACT EXTRACTION W/PHACO Right 04/16/2013   Procedure: CATARACT EXTRACTION PHACO AND INTRAOCULAR LENS PLACEMENT (IOC);  Surgeon: Tonny Branch, MD;  Location: [facility];  Service: Ophthalmology;  Laterality: Right;  CDE:15.88  . CESAREAN SECTION  1986  . COLONOSCOPY N/A 07/22/2015   Procedure: COLONOSCOPY;  Surgeon: Daneil Dolin, MD;  Location: [facility];  Service: Endoscopy;  Laterality: N/A;  0830  . JOINT REPLACEMENT Right 2010    hip   Social History   Occupational History  . Not on file.   Social History Main Topics  . Smoking status: Never Smoker  . Smokeless tobacco: Never Used  . Alcohol use No  . Drug use: No  . Sexual activity: Not on file

## 2015-12-14 ENCOUNTER — Encounter (INDEPENDENT_AMBULATORY_CARE_PROVIDER_SITE_OTHER): Payer: Self-pay | Admitting: *Deleted

## 2015-12-15 ENCOUNTER — Ambulatory Visit (INDEPENDENT_AMBULATORY_CARE_PROVIDER_SITE_OTHER): Payer: Medicare HMO | Admitting: Orthopaedic Surgery

## 2015-12-15 ENCOUNTER — Encounter (INDEPENDENT_AMBULATORY_CARE_PROVIDER_SITE_OTHER): Payer: Self-pay | Admitting: *Deleted

## 2015-12-16 DIAGNOSIS — Z1231 Encounter for screening mammogram for malignant neoplasm of breast: Secondary | ICD-10-CM | POA: Diagnosis not present

## 2015-12-16 DIAGNOSIS — Z01419 Encounter for gynecological examination (general) (routine) without abnormal findings: Secondary | ICD-10-CM | POA: Diagnosis not present

## 2015-12-16 DIAGNOSIS — R8271 Bacteriuria: Secondary | ICD-10-CM | POA: Diagnosis not present

## 2015-12-21 ENCOUNTER — Ambulatory Visit (HOSPITAL_COMMUNITY)
Admission: RE | Admit: 2015-12-21 | Discharge: 2015-12-21 | Disposition: A | Discharge status: Home / Self Care | Payer: Medicare HMO | Source: Ambulatory Visit | Attending: Orthopaedic Surgery | Admitting: Orthopaedic Surgery

## 2015-12-21 ENCOUNTER — Ambulatory Visit (HOSPITAL_COMMUNITY): Payer: Medicare HMO

## 2015-12-21 DIAGNOSIS — M4316 Spondylolisthesis, lumbar region: Secondary | ICD-10-CM | POA: Diagnosis not present

## 2015-12-21 DIAGNOSIS — M48061 Spinal stenosis, lumbar region without neurogenic claudication: Secondary | ICD-10-CM | POA: Insufficient documentation

## 2015-12-21 DIAGNOSIS — M48062 Spinal stenosis, lumbar region with neurogenic claudication: Secondary | ICD-10-CM | POA: Diagnosis not present

## 2015-12-21 DIAGNOSIS — M5126 Other intervertebral disc displacement, lumbar region: Secondary | ICD-10-CM | POA: Diagnosis not present

## 2015-12-21 DIAGNOSIS — M1288 Other specific arthropathies, not elsewhere classified, other specified site: Secondary | ICD-10-CM | POA: Diagnosis not present

## 2015-12-22 ENCOUNTER — Encounter (INDEPENDENT_AMBULATORY_CARE_PROVIDER_SITE_OTHER): Payer: Self-pay | Admitting: Orthopaedic Surgery

## 2015-12-22 ENCOUNTER — Ambulatory Visit (INDEPENDENT_AMBULATORY_CARE_PROVIDER_SITE_OTHER): Payer: Medicare HMO | Admitting: Orthopaedic Surgery

## 2015-12-22 VITALS — Ht 63.0 in | Wt 187.0 lb

## 2015-12-22 DIAGNOSIS — M48062 Spinal stenosis, lumbar region with neurogenic claudication: Secondary | ICD-10-CM | POA: Diagnosis not present

## 2015-12-22 DIAGNOSIS — Z96641 Presence of right artificial hip joint: Secondary | ICD-10-CM

## 2015-12-22 DIAGNOSIS — M5116 Intervertebral disc disorders with radiculopathy, lumbar region: Secondary | ICD-10-CM | POA: Insufficient documentation

## 2015-12-22 NOTE — Progress Notes (Signed)
Office Visit Note   Patient: Nichole Brewer           Date of Birth: Nov 09, 1950           MRN: [id number] Visit Date: 12/22/2015              Requested by: Asencion Noble, MD [address] [place], [place] [address] PCP: Asencion Noble, MD   Assessment & Plan: Visit Diagnoses:  1. Herniation of lumbar intervertebral disc with radiculopathy   2. Spinal stenosis of lumbar region with neurogenic claudication   3. H/O total hip arthroplasty, right     Plan: Patient has had ongoing progressive symptoms now for greater than 3 years. MRI scan shows residual large disc herniation at L1-2 with cephalad and caudad migrated disc material that measures 3.1 cm in length with severe lateral recess stenosis and central stenosis. She also has the severe stenosis at L4-5 with anterolisthesis bilateral lateral recess stenosis and by foraminal stenosis. Her symptoms are of neurogenic claudication. In the past has had problems with right anterior thigh pain more recently more left anterior thigh pain related to the L1-2 HNP. Long discussion was held with the patient and her daughters with her today about options. Surgical correction since she is failed the therapy, exercise anti-inflammatories multiple epidural injections both the L1-2 level and also L4-5 level would include surgical stabilization of the L4-5 level for the listhesis with stenosis and also microdiscectomy at the L1-2 level on both the right and left side where she has large amount of disc. L4-5 level would include the interbody cage with pedicle instrumentation. We discussed postoperative bracing length time the hospital potential risk for the surgery possibility of nonunion possibility of recurrent disc herniations. She understands that she has some disc degeneration at L5-S1 and this might progress in the future. Questions were elicited and answered. She will consider her options and call if she like to proceed.  Follow-Up Instructions: No  Follow-up on file.   Orders:  No orders of the defined types were placed in this encounter.  No orders of the defined types were placed in this encounter.     Procedures: No procedures performed   Clinical Data: No additional findings.   Subjective: Chief Complaint  Patient presents with  . Lower Back - Pain    Patient returns to review MRI Lumbar Spine. She states that there is no change in her symptoms.   Patient states she still has lesion or grocery cart when she goes out shopping. She walked to the milk section and then back to the car. When she cooks she has to spend some time leaning over the counter with her arms to unload her back otherwise her legs get weak she has to either sit down or she feels like she may fall. She has had prednisone pack in the past she has had epidurals both at the L1-2 level where she has large disc herniation and also at the L4-5 level where she has severe spinal stenosis with anterolisthesis lateral recess stenosis and by foraminal stenosis. Patient's daughter is with her who gave additional history we reviewed images of her new MRI comparison at 2015 MRI may give her copies of the new MRI report. Rather prolonged discussion about options of her progressive symptoms. She states she is not ready to consider operative treatment.  Review of Systems  Constitutional: Negative for chills and diaphoresis.  HENT: Negative for ear discharge, ear pain and nosebleeds.   Eyes: Negative for  discharge and visual disturbance.       Previous cataract surgery  Respiratory: Negative for cough, choking and shortness of breath.   Cardiovascular: Negative for chest pain and palpitations.  Gastrointestinal: Negative for abdominal distention and abdominal pain.  Endocrine: Positive for polyuria. Negative for cold intolerance and heat intolerance.  Genitourinary: Negative for flank pain and hematuria.       Previous C-section  Musculoskeletal:       Claudication  symptoms with leg weakness after walking. She gets weakness in her legs when she tries to cook  meals and has to lean forward over-the-counter on her elbows to get some relief. She does better with [place] flexed position than she does when she is standing straight  Skin: Negative for rash and wound.  Neurological: Positive for weakness. Negative for seizures and speech difficulty.  Hematological: Negative for adenopathy. Does not bruise/bleed easily.  Psychiatric/Behavioral: Negative for agitation and suicidal ideas.     Objective: Vital Signs: Ht 5\' 3"  (1.6 m)   Wt 187 lb (84.8 kg)   BMI 33.13 kg/m   Physical Exam  Constitutional: She is oriented to person, place, and time. She appears well-developed.  HENT:  Head: Normocephalic.  Right Ear: External ear normal.  Left Ear: External ear normal.  Eyes: Pupils are equal, round, and reactive to light.  Neck: No tracheal deviation present. No thyromegaly present.  Cardiovascular: Normal rate and regular rhythm.   Positive for HTN  Pulmonary/Chest: Effort normal. She has no wheezes. She has no rales.  Abdominal: Soft. There is no tenderness.  Musculoskeletal:  Patient is scar from previous right total hip arthroplasty. She has some decreased sensation anterolateral thigh region. Minimal trochanteric bursal tenderness no pain with hip range of motion on the left. Knees reach full extension reflexes are 2+. Anterior tib EHL is strong. She can ambulate in the exam room without limp negative Trendelenburg gait. Distal pulses are 2+. Sensation the foot is intact.  Neurological: She is alert and oriented to person, place, and time.  Skin: Skin is warm and dry.  Psychiatric: She has a normal mood and affect. Her behavior is normal.    Ortho Exam no synovitis of the fingers wrists. Elbows reach full extension normal upper extremity reflexes.  Specialty Comments:  No specialty comments available.  Imaging: Mr Lumbar Spine W/o  Contrast  Result Date: 12/21/2015 CLINICAL DATA:  Initial evaluation for spinal stenosis with neurogenic claudication. EXAM: MRI LUMBAR SPINE WITHOUT CONTRAST TECHNIQUE: Multiplanar, multisequence MR imaging of the lumbar spine was performed. No intravenous contrast was administered. COMPARISON:  Previous MRI from 09/03/2013. FINDINGS: Segmentation: Normal segmentation. Lowest well-formed disc is labeled the L5-S1 level. Alignment: Dextroscoliosis again noted, stable from previous. 4 mm anterolisthesis of L4 on L5 also stable. No interval listhesis or subluxation. Vertebrae: Vertebral body heights are maintained. No evidence for acute or chronic fracture. Reactive endplate changes present within T12-L1 interspace, stable from prior. Few scattered chronic endplate Schmorl's nodes noted, slightly progressed as compared to previous. Conus medullaris: Extends to the L1 level and appears normal. Paraspinal and other soft tissues: Visualized soft tissues within normal limits. Gallstones noted. Remainder the visualized visualized visceral structures within normal limits. Disc levels: T10-11: Seen only on sagittal projection. Mild diffuse disc bulge with disc desiccation. Probable superimposed small right central disc protrusion (series 3, image 10). No significant stenosis. T11-12: Seen only on sagittal projection. Minimal disc bulge with disc desiccation. No stenosis. T12-L1: Diffuse degenerative disc bulge with disc desiccation. Reactive endplate  changes with endplate osteophytosis. No focal disc protrusion. No significant stenosis. L1-2: Diffuse degenerative disc bulge with disc desiccation. Disc bulging slightly eccentric to the right. Large disc extrusion with both cephalad and caudad migration is present. Disc extrusion is largely central in nature, but courses inferiorly to the left to involve the left subarticular and foraminal regions. Migration measures approximately 3.1 cm in total. Superimposed facet and  ligamentum flavum hypertrophy, similar to previous. Mild to moderate canal stenosis also similar. Protruding disc impinges upon the left L2 nerve root (series 6, image 15). Mild left bilateral foraminal narrowing. L2-3: Diffuse degenerative disc bulge with disc desiccation and intervertebral disc space narrowing. Disc bulging slightly eccentric to the left. This potentially irritates the exiting left L2 nerve root. Superimposed tiny central disc protrusion minimally indents the ventral thecal sac. Superimposed facet and ligamentum flavum hypertrophy. Mild canal and left subarticular stenosis. Mild bilateral foraminal narrowing, slightly worse on the left. L3-4: Mild annular disc bulge with disc desiccation. Moderate facet arthrosis with ligamentum flavum hypertrophy. Minimal canal narrowing. No significant foraminal stenosis. L4-5: 3 mm anterolisthesis of L4 on L5. Mild disc bulge with advanced facet arthropathy. Ligamentum flavum hypertrophy. Resultant moderate to severe canal and subarticular stenosis, potentially affecting the bilateral L5 nerve roots. Mild to moderate foraminal narrowing, also slightly worse than left, also potentially affecting the L4 nerve roots. L5-S1: Minimal annular disc bulge. Moderate bilateral facet arthrosis. No significant canal narrowing. Mild to moderate foraminal stenosis, right slightly worse than left. IMPRESSION: 1. Large central/left subarticular disc extrusion with migration as above, with left L2 nerve root impingement. 2. 4 mm anterolisthesis of L4 on L5 with associated disc bulge and facet arthropathy, resulting in moderate to severe canal and subarticular stenosis. Changes are relatively similar to previous. 3. Additional more mild multilevel degenerative spondylolysis as above, relatively similar to previous exam. Please see above report for a full description of these findings. Electronically Signed   By: Jeannine Boga M.D.   On: 12/21/2015 23:47     PMFS  History: Patient Active Problem List   Diagnosis Date Noted  . Herniation of lumbar intervertebral disc with radiculopathy 12/22/2015  . Spinal stenosis of lumbar region with neurogenic claudication 11/30/2015  . Post-operative nausea and vomiting   . Encounter for screening colonoscopy 06/29/2015  . PONV (postoperative nausea and vomiting) 06/29/2015   Past Medical History:  Diagnosis Date  . Arthritis   . DJD (degenerative joint disease)   . Hypertension   . Osteoarthritis   . PONV (postoperative nausea and vomiting)    after colonoscopy    Family History  Problem Relation Age of Onset  . Colon cancer Neg Hx     Past Surgical History:  Procedure Laterality Date  . CATARACT EXTRACTION W/PHACO Left 04/06/2013   Procedure: CATARACT EXTRACTION PHACO AND INTRAOCULAR LENS PLACEMENT (IOC);  Surgeon: Tonny Branch, MD;  Location: [facility];  Service: Ophthalmology;  Laterality: Left;  CDE 13.72  . CATARACT EXTRACTION W/PHACO Right 04/16/2013   Procedure: CATARACT EXTRACTION PHACO AND INTRAOCULAR LENS PLACEMENT (IOC);  Surgeon: Tonny Branch, MD;  Location: [facility];  Service: Ophthalmology;  Laterality: Right;  CDE:15.88  . CESAREAN SECTION  1986  . COLONOSCOPY N/A 07/22/2015   Procedure: COLONOSCOPY;  Surgeon: Daneil Dolin, MD;  Location: [facility];  Service: Endoscopy;  Laterality: N/A;  0830  . JOINT REPLACEMENT Right 2010   hip   Social History   Occupational History  . Not on file.   Social History Main  Topics  . Smoking status: Never Smoker  . Smokeless tobacco: Never Used  . Alcohol use No  . Drug use: No  . Sexual activity: Not on file

## 2015-12-30 DIAGNOSIS — Z23 Encounter for immunization: Secondary | ICD-10-CM | POA: Diagnosis not present

## 2016-05-25 DIAGNOSIS — M5116 Intervertebral disc disorders with radiculopathy, lumbar region: Secondary | ICD-10-CM | POA: Diagnosis not present

## 2016-05-25 DIAGNOSIS — I10 Essential (primary) hypertension: Secondary | ICD-10-CM | POA: Diagnosis not present

## 2016-06-08 DIAGNOSIS — H43812 Vitreous degeneration, left eye: Secondary | ICD-10-CM | POA: Diagnosis not present

## 2016-09-26 DIAGNOSIS — Z23 Encounter for immunization: Secondary | ICD-10-CM | POA: Diagnosis not present

## 2016-09-26 DIAGNOSIS — M4800 Spinal stenosis, site unspecified: Secondary | ICD-10-CM | POA: Diagnosis not present

## 2016-10-11 DIAGNOSIS — D225 Melanocytic nevi of trunk: Secondary | ICD-10-CM | POA: Diagnosis not present

## 2016-10-11 DIAGNOSIS — D2271 Melanocytic nevi of right lower limb, including hip: Secondary | ICD-10-CM | POA: Diagnosis not present

## 2016-10-11 DIAGNOSIS — D485 Neoplasm of uncertain behavior of skin: Secondary | ICD-10-CM | POA: Diagnosis not present

## 2016-10-11 DIAGNOSIS — D1801 Hemangioma of skin and subcutaneous tissue: Secondary | ICD-10-CM | POA: Diagnosis not present

## 2016-11-02 DIAGNOSIS — Z23 Encounter for immunization: Secondary | ICD-10-CM | POA: Diagnosis not present

## 2016-11-14 DIAGNOSIS — E119 Type 2 diabetes mellitus without complications: Secondary | ICD-10-CM | POA: Diagnosis not present

## 2016-11-14 DIAGNOSIS — I10 Essential (primary) hypertension: Secondary | ICD-10-CM | POA: Diagnosis not present

## 2016-11-14 DIAGNOSIS — E785 Hyperlipidemia, unspecified: Secondary | ICD-10-CM | POA: Diagnosis not present

## 2016-11-14 DIAGNOSIS — Z79899 Other long term (current) drug therapy: Secondary | ICD-10-CM | POA: Diagnosis not present

## 2016-11-14 DIAGNOSIS — R7303 Prediabetes: Secondary | ICD-10-CM | POA: Diagnosis not present

## 2016-11-16 ENCOUNTER — Other Ambulatory Visit: Payer: Self-pay | Admitting: Neurosurgery

## 2016-11-16 DIAGNOSIS — M4310 Spondylolisthesis, site unspecified: Secondary | ICD-10-CM

## 2016-11-19 DIAGNOSIS — I10 Essential (primary) hypertension: Secondary | ICD-10-CM | POA: Diagnosis not present

## 2016-11-19 DIAGNOSIS — E785 Hyperlipidemia, unspecified: Secondary | ICD-10-CM | POA: Diagnosis not present

## 2016-11-19 DIAGNOSIS — Z23 Encounter for immunization: Secondary | ICD-10-CM | POA: Diagnosis not present

## 2016-11-27 ENCOUNTER — Other Ambulatory Visit (HOSPITAL_COMMUNITY): Payer: Self-pay | Admitting: Neurosurgery

## 2016-11-27 DIAGNOSIS — M4310 Spondylolisthesis, site unspecified: Secondary | ICD-10-CM

## 2016-11-30 ENCOUNTER — Ambulatory Visit (HOSPITAL_COMMUNITY)
Admission: RE | Admit: 2016-11-30 | Discharge: 2016-11-30 | Disposition: A | Discharge status: Home / Self Care | Payer: Medicare HMO | Source: Ambulatory Visit | Attending: Neurosurgery | Admitting: Neurosurgery

## 2016-11-30 DIAGNOSIS — M4804 Spinal stenosis, thoracic region: Secondary | ICD-10-CM | POA: Diagnosis not present

## 2016-11-30 DIAGNOSIS — M48061 Spinal stenosis, lumbar region without neurogenic claudication: Secondary | ICD-10-CM | POA: Diagnosis not present

## 2016-11-30 DIAGNOSIS — M5135 Other intervertebral disc degeneration, thoracolumbar region: Secondary | ICD-10-CM | POA: Insufficient documentation

## 2016-11-30 DIAGNOSIS — M5126 Other intervertebral disc displacement, lumbar region: Secondary | ICD-10-CM | POA: Diagnosis not present

## 2016-11-30 DIAGNOSIS — M5136 Other intervertebral disc degeneration, lumbar region: Secondary | ICD-10-CM | POA: Diagnosis not present

## 2016-11-30 DIAGNOSIS — K8020 Calculus of gallbladder without cholecystitis without obstruction: Secondary | ICD-10-CM | POA: Insufficient documentation

## 2016-11-30 DIAGNOSIS — M4310 Spondylolisthesis, site unspecified: Secondary | ICD-10-CM

## 2016-11-30 DIAGNOSIS — M545 Low back pain: Secondary | ICD-10-CM | POA: Diagnosis not present

## 2016-12-13 DIAGNOSIS — M4310 Spondylolisthesis, site unspecified: Secondary | ICD-10-CM | POA: Diagnosis not present

## 2016-12-28 DIAGNOSIS — Z1231 Encounter for screening mammogram for malignant neoplasm of breast: Secondary | ICD-10-CM | POA: Diagnosis not present

## 2016-12-28 DIAGNOSIS — Z01419 Encounter for gynecological examination (general) (routine) without abnormal findings: Secondary | ICD-10-CM | POA: Diagnosis not present

## 2017-01-11 DIAGNOSIS — Z23 Encounter for immunization: Secondary | ICD-10-CM | POA: Diagnosis not present

## 2017-01-16 DIAGNOSIS — M4310 Spondylolisthesis, site unspecified: Secondary | ICD-10-CM | POA: Diagnosis not present

## 2017-01-16 DIAGNOSIS — Z01812 Encounter for preprocedural laboratory examination: Secondary | ICD-10-CM | POA: Diagnosis not present

## 2017-01-24 DIAGNOSIS — M48062 Spinal stenosis, lumbar region with neurogenic claudication: Secondary | ICD-10-CM | POA: Diagnosis not present

## 2017-01-24 DIAGNOSIS — M4316 Spondylolisthesis, lumbar region: Secondary | ICD-10-CM | POA: Diagnosis not present

## 2017-03-12 DIAGNOSIS — Z96649 Presence of unspecified artificial hip joint: Secondary | ICD-10-CM | POA: Diagnosis not present

## 2017-03-12 DIAGNOSIS — Z6834 Body mass index (BMI) 34.0-34.9, adult: Secondary | ICD-10-CM | POA: Diagnosis not present

## 2017-03-12 DIAGNOSIS — G8929 Other chronic pain: Secondary | ICD-10-CM | POA: Diagnosis not present

## 2017-03-12 DIAGNOSIS — E669 Obesity, unspecified: Secondary | ICD-10-CM | POA: Diagnosis not present

## 2017-03-12 DIAGNOSIS — Z791 Long term (current) use of non-steroidal anti-inflammatories (NSAID): Secondary | ICD-10-CM | POA: Diagnosis not present

## 2017-03-12 DIAGNOSIS — I10 Essential (primary) hypertension: Secondary | ICD-10-CM | POA: Diagnosis not present

## 2017-03-12 DIAGNOSIS — I951 Orthostatic hypotension: Secondary | ICD-10-CM | POA: Diagnosis not present

## 2017-05-23 DIAGNOSIS — Z1283 Encounter for screening for malignant neoplasm of skin: Secondary | ICD-10-CM | POA: Diagnosis not present

## 2017-05-23 DIAGNOSIS — L821 Other seborrheic keratosis: Secondary | ICD-10-CM | POA: Diagnosis not present

## 2017-05-24 DIAGNOSIS — E785 Hyperlipidemia, unspecified: Secondary | ICD-10-CM | POA: Diagnosis not present

## 2017-05-24 DIAGNOSIS — Z79899 Other long term (current) drug therapy: Secondary | ICD-10-CM | POA: Diagnosis not present

## 2017-05-31 ENCOUNTER — Ambulatory Visit (HOSPITAL_COMMUNITY)
Admission: RE | Admit: 2017-05-31 | Discharge: 2017-05-31 | Disposition: A | Discharge status: Home / Self Care | Payer: Medicare HMO | Source: Ambulatory Visit | Attending: Internal Medicine | Admitting: Internal Medicine

## 2017-05-31 ENCOUNTER — Other Ambulatory Visit (HOSPITAL_COMMUNITY): Payer: Self-pay | Admitting: Internal Medicine

## 2017-05-31 DIAGNOSIS — R52 Pain, unspecified: Secondary | ICD-10-CM

## 2017-05-31 DIAGNOSIS — M25511 Pain in right shoulder: Secondary | ICD-10-CM | POA: Diagnosis not present

## 2017-05-31 DIAGNOSIS — R071 Chest pain on breathing: Secondary | ICD-10-CM

## 2017-05-31 DIAGNOSIS — I10 Essential (primary) hypertension: Secondary | ICD-10-CM | POA: Diagnosis not present

## 2017-05-31 DIAGNOSIS — M19011 Primary osteoarthritis, right shoulder: Secondary | ICD-10-CM | POA: Diagnosis not present

## 2017-05-31 DIAGNOSIS — E785 Hyperlipidemia, unspecified: Secondary | ICD-10-CM | POA: Diagnosis not present

## 2017-05-31 DIAGNOSIS — R079 Chest pain, unspecified: Secondary | ICD-10-CM | POA: Diagnosis not present

## 2017-05-31 DIAGNOSIS — Z6834 Body mass index (BMI) 34.0-34.9, adult: Secondary | ICD-10-CM | POA: Diagnosis not present

## 2017-08-21 DIAGNOSIS — Z79899 Other long term (current) drug therapy: Secondary | ICD-10-CM | POA: Diagnosis not present

## 2017-08-21 DIAGNOSIS — E785 Hyperlipidemia, unspecified: Secondary | ICD-10-CM | POA: Diagnosis not present

## 2017-08-30 DIAGNOSIS — I10 Essential (primary) hypertension: Secondary | ICD-10-CM | POA: Diagnosis not present

## 2017-08-30 DIAGNOSIS — E785 Hyperlipidemia, unspecified: Secondary | ICD-10-CM | POA: Diagnosis not present

## 2017-09-30 ENCOUNTER — Other Ambulatory Visit (HOSPITAL_COMMUNITY): Payer: Self-pay | Admitting: Internal Medicine

## 2017-09-30 DIAGNOSIS — I10 Essential (primary) hypertension: Secondary | ICD-10-CM | POA: Diagnosis not present

## 2017-09-30 DIAGNOSIS — R1011 Right upper quadrant pain: Secondary | ICD-10-CM

## 2017-10-04 ENCOUNTER — Ambulatory Visit (HOSPITAL_COMMUNITY)
Admission: RE | Admit: 2017-10-04 | Discharge: 2017-10-04 | Disposition: A | Discharge status: Home / Self Care | Payer: Medicare HMO | Source: Ambulatory Visit | Attending: Internal Medicine | Admitting: Internal Medicine

## 2017-10-04 DIAGNOSIS — R1011 Right upper quadrant pain: Secondary | ICD-10-CM | POA: Diagnosis not present

## 2017-10-04 DIAGNOSIS — K8020 Calculus of gallbladder without cholecystitis without obstruction: Secondary | ICD-10-CM | POA: Insufficient documentation

## 2017-10-09 ENCOUNTER — Ambulatory Visit (HOSPITAL_COMMUNITY): Payer: Medicare HMO

## 2017-11-08 DIAGNOSIS — K8020 Calculus of gallbladder without cholecystitis without obstruction: Secondary | ICD-10-CM | POA: Diagnosis not present

## 2017-11-22 DIAGNOSIS — Z23 Encounter for immunization: Secondary | ICD-10-CM | POA: Diagnosis not present

## 2017-11-22 DIAGNOSIS — E785 Hyperlipidemia, unspecified: Secondary | ICD-10-CM | POA: Diagnosis not present

## 2017-11-22 DIAGNOSIS — I10 Essential (primary) hypertension: Secondary | ICD-10-CM | POA: Diagnosis not present

## 2017-12-19 DIAGNOSIS — M4310 Spondylolisthesis, site unspecified: Secondary | ICD-10-CM | POA: Diagnosis not present

## 2018-01-30 DIAGNOSIS — L720 Epidermal cyst: Secondary | ICD-10-CM | POA: Diagnosis not present

## 2018-01-31 DIAGNOSIS — Z961 Presence of intraocular lens: Secondary | ICD-10-CM | POA: Diagnosis not present

## 2018-01-31 DIAGNOSIS — Z01419 Encounter for gynecological examination (general) (routine) without abnormal findings: Secondary | ICD-10-CM | POA: Diagnosis not present

## 2018-01-31 DIAGNOSIS — H5212 Myopia, left eye: Secondary | ICD-10-CM | POA: Diagnosis not present

## 2018-01-31 DIAGNOSIS — H43813 Vitreous degeneration, bilateral: Secondary | ICD-10-CM | POA: Diagnosis not present

## 2018-01-31 DIAGNOSIS — Z9849 Cataract extraction status, unspecified eye: Secondary | ICD-10-CM | POA: Diagnosis not present

## 2018-01-31 DIAGNOSIS — Z1231 Encounter for screening mammogram for malignant neoplasm of breast: Secondary | ICD-10-CM | POA: Diagnosis not present

## 2018-02-04 DIAGNOSIS — R69 Illness, unspecified: Secondary | ICD-10-CM | POA: Diagnosis not present

## 2018-02-04 DIAGNOSIS — I10 Essential (primary) hypertension: Secondary | ICD-10-CM | POA: Diagnosis not present

## 2018-02-05 DIAGNOSIS — R69 Illness, unspecified: Secondary | ICD-10-CM | POA: Diagnosis not present

## 2018-02-05 DIAGNOSIS — I10 Essential (primary) hypertension: Secondary | ICD-10-CM | POA: Diagnosis not present

## 2018-02-05 DIAGNOSIS — Z79899 Other long term (current) drug therapy: Secondary | ICD-10-CM | POA: Diagnosis not present

## 2018-02-05 DIAGNOSIS — G4700 Insomnia, unspecified: Secondary | ICD-10-CM | POA: Diagnosis not present

## 2018-03-08 IMAGING — MR MR LUMBAR SPINE W/O CM
4 of 5 series · 13 of 48 positions shown · non-contrast
Comparison: Previous MRI from 09/03/2013.

CLINICAL DATA: Initial evaluation for spinal stenosis with
neurogenic claudication.

EXAM:
MRI LUMBAR SPINE WITHOUT CONTRAST
TECHNIQUE: Multiplanar, multisequence MR imaging of the lumbar spine was
performed. No intravenous contrast was administered.

[Series 3: T2 · sagittal · 4.0mm · 0.76mm/px · 4 of 15 slices shown (1 of 2)]
[im 1/15]
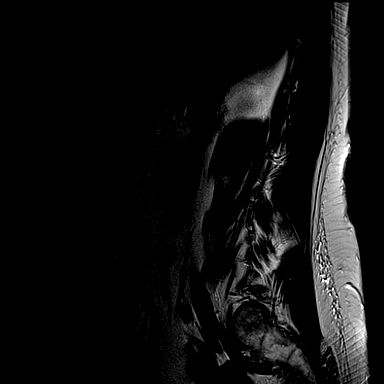
[im 3/15]
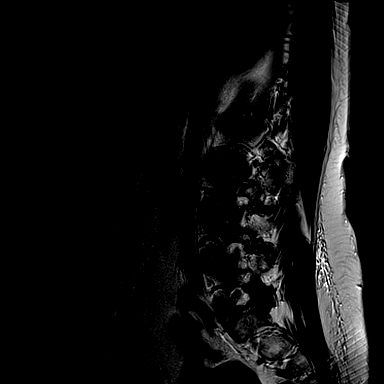
[im 9/15]
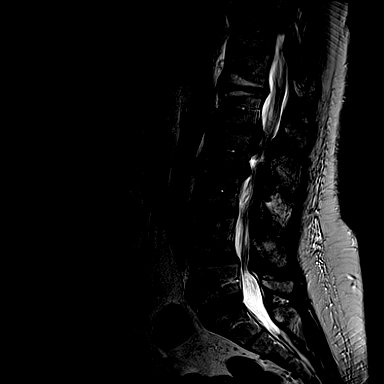
[im 15/15]
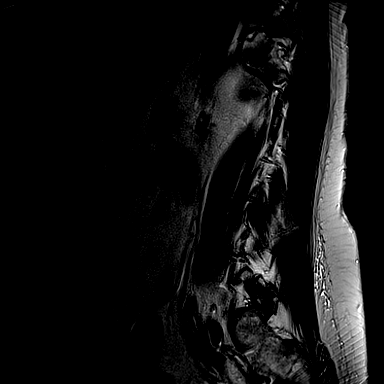

[Series 4: T1 · sagittal · 4.0mm · 0.38mm/px · 3 of 15 slices shown (1 of 2)]
[im 1/15]
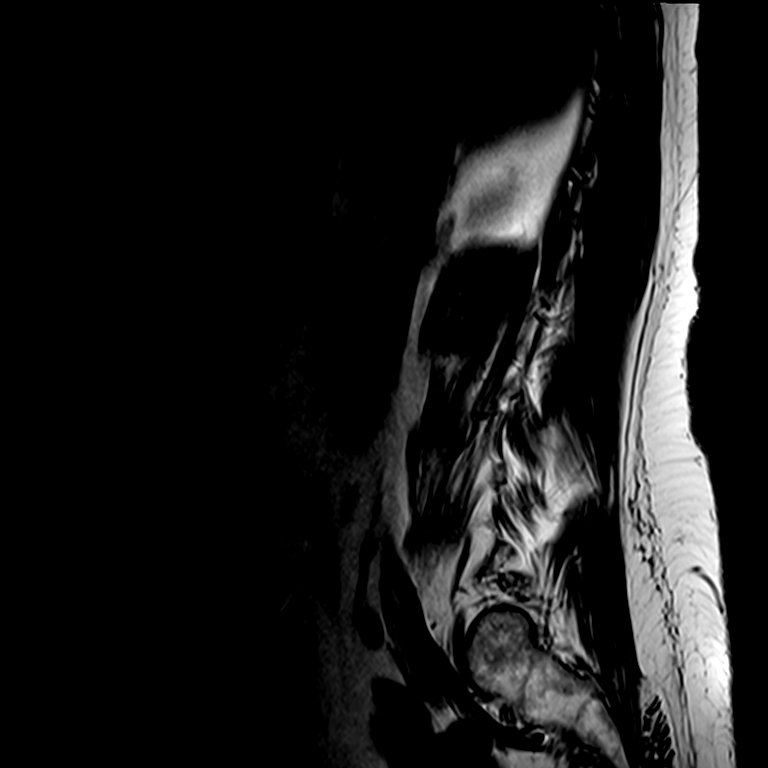
[im 8/15]
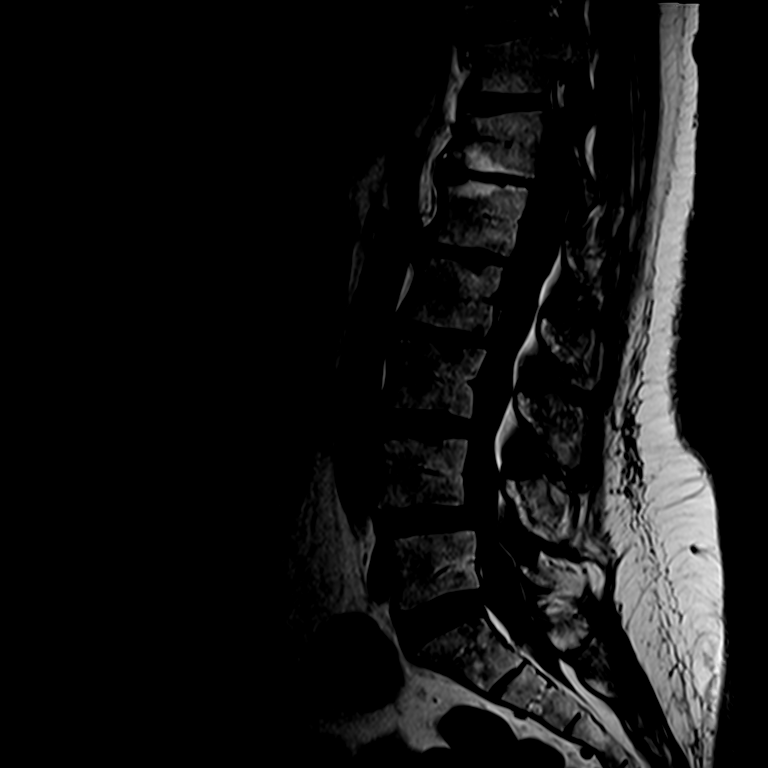
[im 15/15]
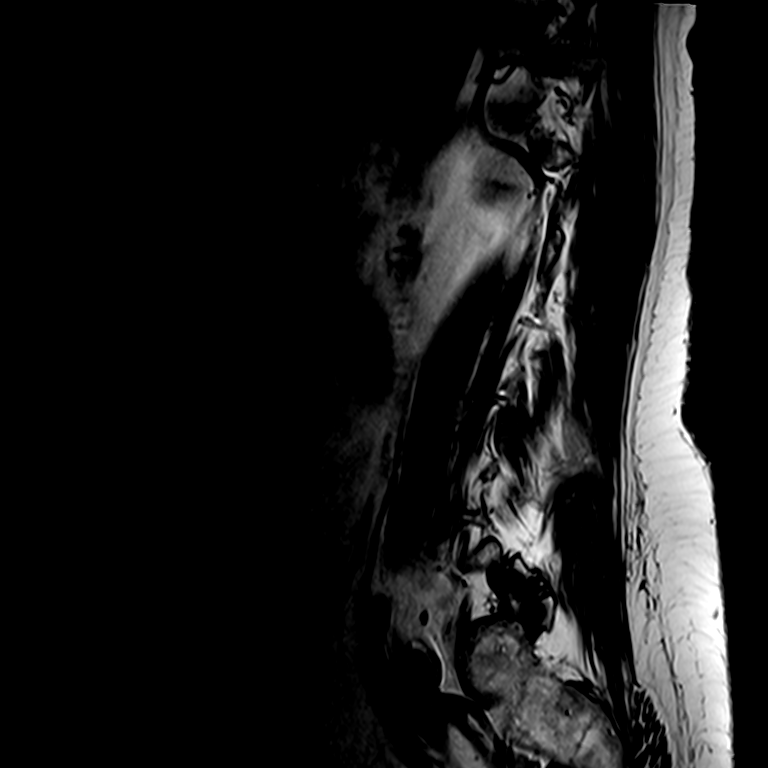

[Series 6: T2 · axial · 4.0mm · 0.21mm/px · z∈[-98,+58]mm · 3 of 44 slices shown (2 of 2)]
[im 6/44]
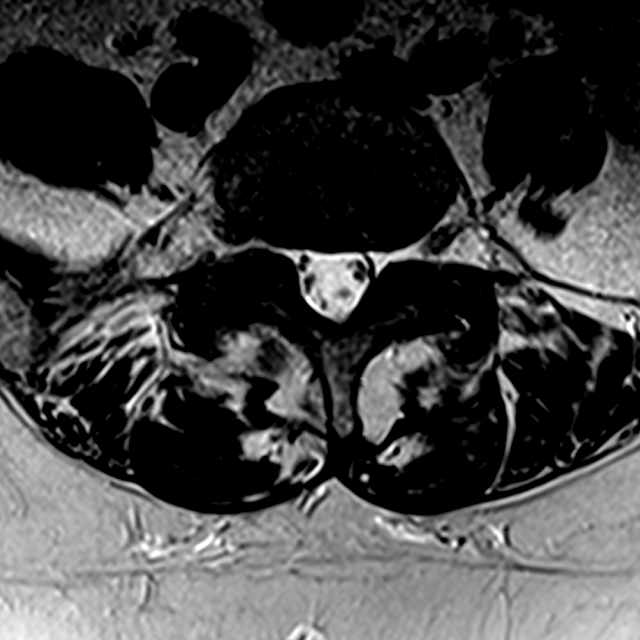
[im 23/44]
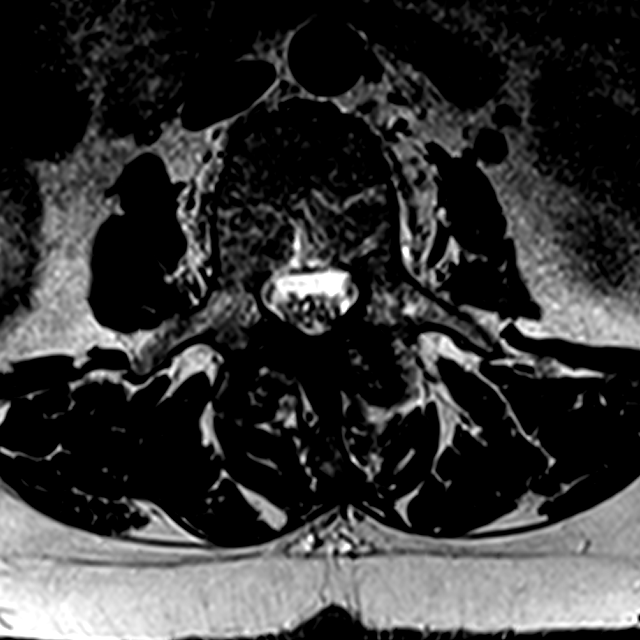
[im 38/44]
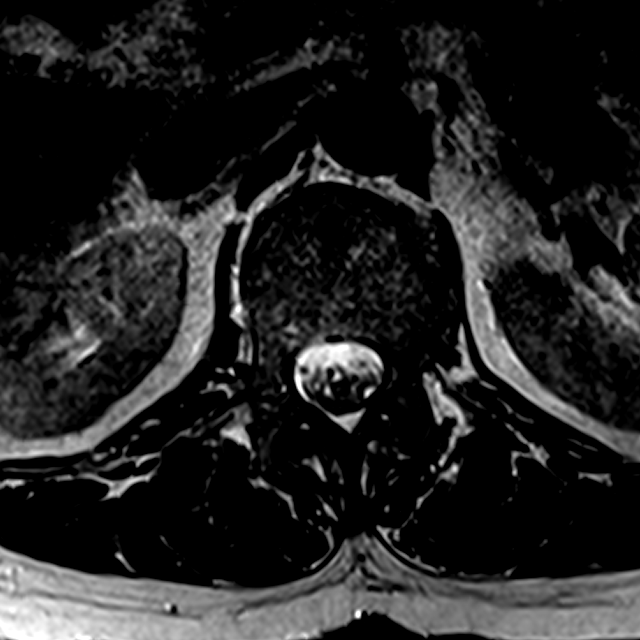

[Series 7: T1 · axial · 4.0mm · 0.21mm/px · z∈[-98,+58]mm · 3 of 44 slices shown (2 of 2)]
[im 6/44]
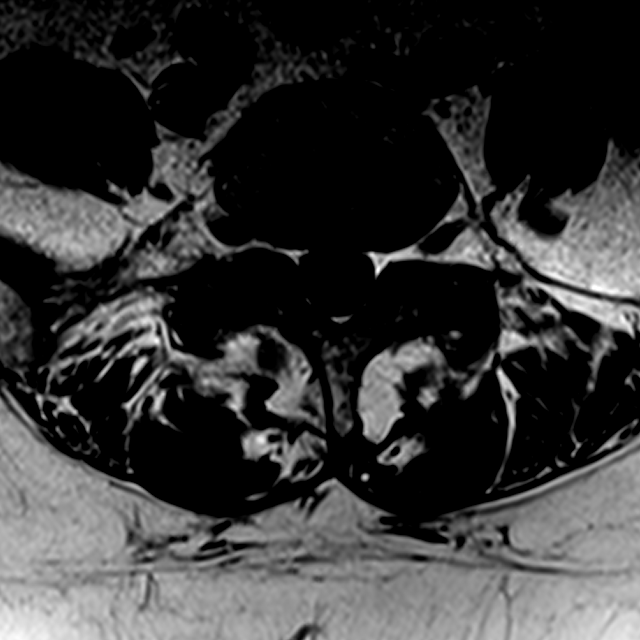
[im 23/44]
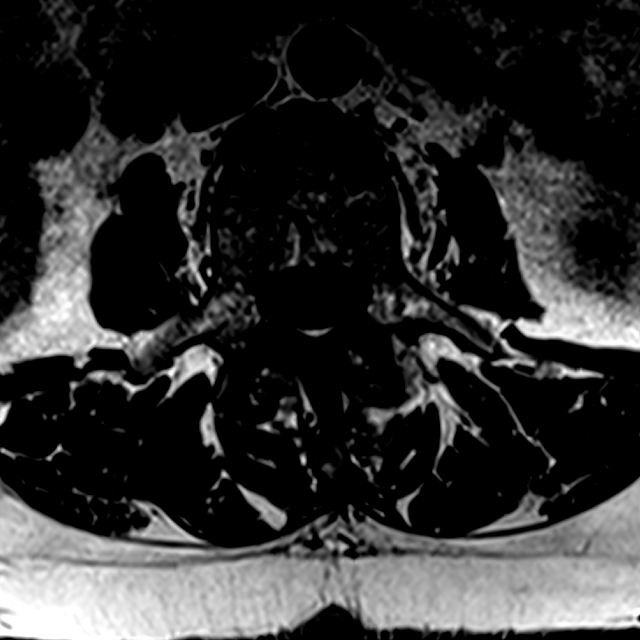
[im 38/44]
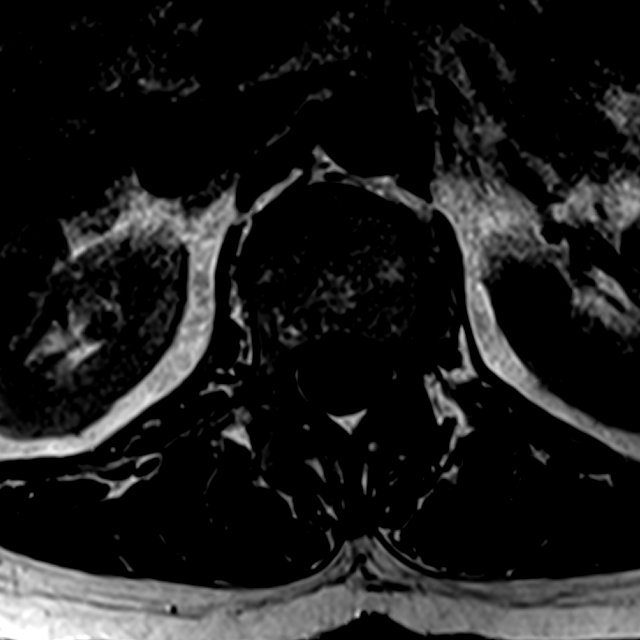

[13 of 48 positions shown; findings below may reference images not displayed]

FINDINGS: Segmentation: Normal segmentation. Lowest well-formed disc is
labeled the L5-S1 level.

Alignment: Dextroscoliosis again noted, stable from previous. 4 mm
anterolisthesis of L4 on L5 also stable. No interval listhesis or
subluxation.

Vertebrae: Vertebral body heights are maintained. No evidence for
acute or chronic fracture. Reactive endplate changes present within
T12-L1 interspace, stable from prior. Few scattered chronic endplate
Schmorl's nodes noted, slightly progressed as compared to previous.

Conus medullaris: Extends to the L1 level and appears normal.

Paraspinal and other soft tissues: Visualized soft tissues within
normal limits. Gallstones noted. Remainder the visualized visualized
visceral structures within normal limits.

Disc levels:

T10-11: Seen only on sagittal projection. Mild diffuse disc bulge
with disc desiccation. Probable superimposed small right central
disc protrusion (series 3, image 10). No significant stenosis.

T11-12: Seen only on sagittal projection. Minimal disc bulge with
disc desiccation. No stenosis.

T12-L1: Diffuse degenerative disc bulge with disc desiccation.
Reactive endplate changes with endplate osteophytosis. No focal disc
protrusion. No significant stenosis.

L1-2: Diffuse degenerative disc bulge with disc desiccation. Disc
bulging slightly eccentric to the right. Large disc extrusion with
both cephalad and caudad migration is present. Disc extrusion is
largely central in nature, but courses inferiorly to the left to
involve the left subarticular and foraminal regions. Migration
measures approximately 3.1 cm in total. Superimposed facet and
ligamentum flavum hypertrophy, similar to previous. Mild to moderate
canal stenosis also similar. Protruding disc impinges upon the left
L2 nerve root (series 6, image 15). Mild left bilateral foraminal
narrowing.

L2-3: Diffuse degenerative disc bulge with disc desiccation and
intervertebral disc space narrowing. Disc bulging slightly eccentric
to the left. This potentially irritates the exiting left L2 nerve
root. Superimposed tiny central disc protrusion minimally indents
the ventral thecal sac. Superimposed facet and ligamentum flavum
hypertrophy. Mild canal and left subarticular stenosis. Mild
bilateral foraminal narrowing, slightly worse on the left.

L3-4: Mild annular disc bulge with disc desiccation. Moderate facet
arthrosis with ligamentum flavum hypertrophy. Minimal canal
narrowing. No significant foraminal stenosis.

L4-5: 3 mm anterolisthesis of L4 on L5. Mild disc bulge with
advanced facet arthropathy. Ligamentum flavum hypertrophy. Resultant
moderate to severe canal and subarticular stenosis, potentially
affecting the bilateral L5 nerve roots. Mild to moderate foraminal
narrowing, also slightly worse than left, also potentially affecting
the L4 nerve roots.

L5-S1: Minimal annular disc bulge. Moderate bilateral facet
arthrosis. No significant canal narrowing. Mild to moderate
foraminal stenosis, right slightly worse than left.
IMPRESSION: 1. Large central/left subarticular disc extrusion with migration as
above, with left L2 nerve root impingement.
2. 4 mm anterolisthesis of L4 on L5 with associated disc bulge and
facet arthropathy, resulting in moderate to severe canal and
subarticular stenosis. Changes are relatively similar to previous.
3. Additional more mild multilevel degenerative spondylolysis as
above, relatively similar to previous exam. Please see above report
for a full description of these findings.

## 2018-03-10 DIAGNOSIS — Z6830 Body mass index (BMI) 30.0-30.9, adult: Secondary | ICD-10-CM | POA: Diagnosis not present

## 2018-03-10 DIAGNOSIS — R69 Illness, unspecified: Secondary | ICD-10-CM | POA: Diagnosis not present

## 2018-03-10 DIAGNOSIS — I10 Essential (primary) hypertension: Secondary | ICD-10-CM | POA: Diagnosis not present

## 2018-07-11 DIAGNOSIS — M1991 Primary osteoarthritis, unspecified site: Secondary | ICD-10-CM | POA: Diagnosis not present

## 2018-07-11 DIAGNOSIS — I10 Essential (primary) hypertension: Secondary | ICD-10-CM | POA: Diagnosis not present

## 2018-08-19 ENCOUNTER — Other Ambulatory Visit: Payer: Self-pay

## 2018-08-19 DIAGNOSIS — R6889 Other general symptoms and signs: Secondary | ICD-10-CM | POA: Diagnosis not present

## 2018-08-19 DIAGNOSIS — Z20822 Contact with and (suspected) exposure to covid-19: Secondary | ICD-10-CM

## 2018-08-20 LAB — NOVEL CORONAVIRUS, NAA: SARS-CoV-2, NAA: NOT DETECTED

## 2018-10-31 DIAGNOSIS — Z23 Encounter for immunization: Secondary | ICD-10-CM | POA: Diagnosis not present

## 2018-12-04 DIAGNOSIS — R202 Paresthesia of skin: Secondary | ICD-10-CM | POA: Diagnosis not present

## 2018-12-04 DIAGNOSIS — M542 Cervicalgia: Secondary | ICD-10-CM | POA: Diagnosis not present

## 2018-12-09 ENCOUNTER — Other Ambulatory Visit: Payer: Self-pay

## 2018-12-09 DIAGNOSIS — Z20822 Contact with and (suspected) exposure to covid-19: Secondary | ICD-10-CM

## 2018-12-10 LAB — NOVEL CORONAVIRUS, NAA: SARS-CoV-2, NAA: NOT DETECTED

## 2018-12-17 ENCOUNTER — Emergency Department (HOSPITAL_COMMUNITY)
Admission: EM | Admit: 2018-12-17 | Discharge: 2018-12-17 | Disposition: A | Discharge status: Home / Self Care | Payer: Medicare HMO | Attending: Emergency Medicine | Admitting: Emergency Medicine

## 2018-12-17 ENCOUNTER — Encounter (HOSPITAL_COMMUNITY): Payer: Self-pay

## 2018-12-17 ENCOUNTER — Emergency Department (HOSPITAL_COMMUNITY): Payer: Medicare HMO

## 2018-12-17 ENCOUNTER — Other Ambulatory Visit: Payer: Self-pay

## 2018-12-17 DIAGNOSIS — I10 Essential (primary) hypertension: Secondary | ICD-10-CM | POA: Diagnosis not present

## 2018-12-17 DIAGNOSIS — R072 Precordial pain: Secondary | ICD-10-CM | POA: Diagnosis not present

## 2018-12-17 DIAGNOSIS — Z79899 Other long term (current) drug therapy: Secondary | ICD-10-CM | POA: Diagnosis not present

## 2018-12-17 DIAGNOSIS — R079 Chest pain, unspecified: Secondary | ICD-10-CM | POA: Diagnosis not present

## 2018-12-17 LAB — BASIC METABOLIC PANEL
Anion gap: 12 (ref 5–15)
BUN: 20 mg/dL (ref 8–23)
CO2: 22 mmol/L (ref 22–32)
Calcium: 8.9 mg/dL (ref 8.9–10.3)
Chloride: 102 mmol/L (ref 98–111)
Creatinine, Ser: 0.71 mg/dL (ref 0.44–1.00)
GFR calc Af Amer: >60 mL/min (ref >60)
GFR calc non Af Amer: >60 mL/min (ref >60)
Glucose, Bld: 122 mg/dL — ABNORMAL HIGH (ref 70–99)
Potassium: 3.5 mmol/L (ref 3.5–5.1)
Sodium: 136 mmol/L (ref 135–145)

## 2018-12-17 LAB — CBC
HCT: 39.3 % (ref 36.0–46.0)
Hemoglobin: 12.6 g/dL (ref 12.0–15.0)
MCH: 30.8 pg (ref 26.0–34.0)
MCHC: 32.1 g/dL (ref 30.0–36.0)
MCV: 96.1 fL (ref 80.0–100.0)
Platelets: 268 10*3/uL (ref 150–400)
RBC: 4.09 MIL/uL (ref 3.87–5.11)
RDW: 12.5 % (ref 11.5–15.5)
WBC: 12.1 10*3/uL — ABNORMAL HIGH (ref 4.0–10.5)
nRBC: 0 % (ref 0.0–0.2)

## 2018-12-17 LAB — TROPONIN I (HIGH SENSITIVITY)
Troponin I (High Sensitivity): <2 ng/L (ref <18)
Troponin I (High Sensitivity): <2 ng/L (ref <18)

## 2018-12-17 MED ORDER — SODIUM CHLORIDE 0.9% FLUSH
3.0000 mL | Freq: Once | INTRAVENOUS | Status: AC
  Administered 2018-12-17: 3 mL via INTRAVENOUS

## 2018-12-17 MED ORDER — ASPIRIN 81 MG PO CHEW
324.0000 mg | CHEWABLE_TABLET | Freq: Once | ORAL | Status: AC
  Administered 2018-12-17: 324 mg via ORAL
  Filled 2018-12-17: qty 4

## 2018-12-17 NOTE — ED Notes (Signed)
Ambulated to BR without difficulty.  Gait steady.

## 2018-12-17 NOTE — ED Notes (Signed)
Woke up at 0330 with chest pain, took tylenol and pain was relieved.  When to work and about 8:30, room was spinning, hot and nauseous. Loss control of bladder.  Did not loss consciousness.   Episode lasted about 10 minutes. No chest pain since 0840.   Currently denies chest pain.

## 2018-12-17 NOTE — Discharge Instructions (Addendum)
Make an appointment to follow-up with cardiology.  Start a baby aspirin a day.  As we discussed important return for any new or worse symptoms at all.  Any chest pain lasting longer.  Any passing out.

## 2018-12-17 NOTE — ED Provider Notes (Addendum)
[facility] EMERGENCY DEPARTMENT Provider Note   CSN: [id number] Arrival date & time: 12/17/18  0920     History   Chief Complaint Chief Complaint  Patient presents with  . Chest Pain    HPI Nichole Brewer is a 68 y.o. female.     Patient with 2 episodes of anterior chest pain lasting 30 minutes each.  The first 1 was at 330 this morning.  Described as a tightness in the chest and somewhat sharp and also heavy.  Had a recurrence while at work today at 8:30 in the morning again lasted for 30 minutes then resolve spontaneously.  The pain does radiate a little bit into the neck.  Associated with nausea no shortness of breath.  With the second episode patient had a near syncopal episode and may have become incontinent she felt as if she was going to pass out but she did not pass out.  Denies headache denies any biting of the tongue.  Does appear there probably was incontinence of urine.  Does think she was diaphoretic as well.     Past Medical History:  Diagnosis Date  . Arthritis   . DJD (degenerative joint disease)   . Hypertension   . Osteoarthritis   . PONV (postoperative nausea and vomiting)    after colonoscopy    Patient Active Problem List   Diagnosis Date Noted  . Herniation of lumbar intervertebral disc with radiculopathy 12/22/2015  . Spinal stenosis of lumbar region with neurogenic claudication 11/30/2015  . Post-operative nausea and vomiting   . Encounter for screening colonoscopy 06/29/2015  . PONV (postoperative nausea and vomiting) 06/29/2015    Past Surgical History:  Procedure Laterality Date  . CATARACT EXTRACTION W/PHACO Left 04/06/2013   Procedure: CATARACT EXTRACTION PHACO AND INTRAOCULAR LENS PLACEMENT (IOC);  Surgeon: Tonny Branch, MD;  Location: [facility];  Service: Ophthalmology;  Laterality: Left;  CDE 13.72  . CATARACT EXTRACTION W/PHACO Right 04/16/2013   Procedure: CATARACT EXTRACTION PHACO AND INTRAOCULAR LENS PLACEMENT (IOC);  Surgeon: Tonny Branch, MD;  Location: [facility];  Service: Ophthalmology;  Laterality: Right;  CDE:15.88  . CESAREAN SECTION  1986  . COLONOSCOPY N/A 07/22/2015   Procedure: COLONOSCOPY;  Surgeon: Daneil Dolin, MD;  Location: [facility];  Service: Endoscopy;  Laterality: N/A;  0830  . JOINT REPLACEMENT Right 2010   hip     OB History   No obstetric history on file.      Home Medications    Prior to Admission medications   Medication Sig Start Date End Date Taking? Authorizing Provider  amLODipine (NORVASC) 5 MG tablet Take 5 mg by mouth daily.   Yes [provider]  predniSONE (DELTASONE) 10 MG tablet Take as directed per package instructions 12/04/18  Yes [provider]    Family History Family History  Problem Relation Age of Onset  . Colon cancer Neg Hx     Social History Social History   Tobacco Use  . Smoking status: Never Smoker  . Smokeless tobacco: Never Used  Substance Use Topics  . Alcohol use: No    Alcohol/week: 0.0 standard drinks  . Drug use: No     Allergies   Patient has no known allergies.   Review of Systems Review of Systems  Constitutional: Positive for diaphoresis. Negative for chills and fever.  HENT: Negative for congestion, rhinorrhea and sore throat.   Eyes: Negative for visual disturbance.  Respiratory: Negative for cough and shortness of breath.  Cardiovascular: Positive for chest pain. Negative for leg swelling.  Gastrointestinal: Positive for nausea. Negative for abdominal pain, diarrhea and vomiting.  Genitourinary: Negative for dysuria.  Musculoskeletal: Negative for back pain and neck pain.  Skin: Negative for rash.  Neurological: Negative for dizziness, light-headedness and headaches.  Hematological: Does not bruise/bleed easily.  Psychiatric/Behavioral: Negative for confusion.     Physical Exam Updated Vital Signs BP 123/75   Pulse 78   Temp 98.2 F (36.8 C) (Oral)   Resp 16   Ht 1.6 m (5\' 3" )   Wt 77.6 kg    SpO2 96%   BMI 30.29 kg/m   Physical Exam Vitals signs and nursing note reviewed.  Constitutional:      General: She is not in acute distress.    Appearance: She is well-developed.  HENT:     Head: Normocephalic and atraumatic.  Eyes:     Extraocular Movements: Extraocular movements intact.     Conjunctiva/sclera: Conjunctivae normal.     Pupils: Pupils are equal, round, and reactive to light.  Neck:     Musculoskeletal: Neck supple.  Cardiovascular:     Rate and Rhythm: Normal rate and regular rhythm.     Heart sounds: No murmur.  Pulmonary:     Effort: Pulmonary effort is normal. No respiratory distress.     Breath sounds: Normal breath sounds.  Chest:     Chest wall: No tenderness.  Abdominal:     Palpations: Abdomen is soft.     Tenderness: There is no abdominal tenderness.  Musculoskeletal: Normal range of motion.        General: No swelling.  Skin:    General: Skin is warm and dry.  Neurological:     General: No focal deficit present.     Mental Status: She is alert and oriented to person, place, and time.      ED Treatments / Results  Labs (all labs ordered are listed, but only abnormal results are displayed) Labs Reviewed  BASIC METABOLIC PANEL - Abnormal; Notable for the following components:      Result Value   Glucose, Bld 122 (*)    All other components within normal limits  CBC - Abnormal; Notable for the following components:   WBC 12.1 (*)    All other components within normal limits  TROPONIN I (HIGH SENSITIVITY)  TROPONIN I (HIGH SENSITIVITY)    EKG EKG Interpretation  Date/Time:  Wednesday December 17 2018 09:38:19 EST Ventricular Rate:  82 PR Interval:    QRS Duration: 100 QT Interval:  377 QTC Calculation: 441 R Axis:   45 Text Interpretation: Sinus rhythm Low voltage, precordial leads Abnormal R-wave progression, early transition No significant change since last tracing Confirmed by Fredia Sorrow [phone]) on 12/17/2018 9:42:45 AM    Radiology Dg Chest 2 View  Result Date: 12/17/2018 CLINICAL DATA:  Chest pain and dizziness this morning. Near syncopal episode. EXAM: CHEST - 2 VIEW COMPARISON:  05/31/2017 FINDINGS: The cardiac silhouette, mediastinal and hilar contours are within normal limits and stable. The lungs are clear. No pleural effusions. No worrisome pulmonary lesions. The bony thorax is intact. Remote healed left mid clavicle fracture and remote healed rib fractures. IMPRESSION: No acute cardiopulmonary findings. Electronically Signed   By: Marijo Sanes M.D.   On: 12/17/2018 11:17    Procedures Procedures (including critical care time)  Medications Ordered in ED Medications  sodium chloride flush (NS) 0.9 % injection 3 mL (3 mLs Intravenous Given 12/17/18 1231)  aspirin  chewable tablet 324 mg (324 mg Oral Given 12/17/18 1020)     Initial Impression / Assessment and Plan / ED Course  I have reviewed the triage vital signs and the nursing notes.  Pertinent labs & imaging results that were available during my care of the patient were reviewed by me and considered in my medical decision making (see chart for details).        Patient with a heart score of 5.  But troponin profile very reassuring.  Based on the heart pathway scoring patient could be admitted for observation or could be discharged home with close follow-up.  Patient does have a local primary care doctor.  We also have cardiology available for her to call for follow-up.    Patient never had any true syncope.  Patient has been chest pain-free since 9:00 this morning.  Patient prefers to be discharged home with close follow-up with cardiology versus an observation admission.  Patient will start a baby aspirin a day.  Patient understands is important return for any new or worse symptoms.  Final Clinical Impressions(s) / ED Diagnoses   Final diagnoses:  Precordial pain    ED Discharge Orders    None       Fredia Sorrow, MD  12/17/18 1412    Fredia Sorrow, MD 12/17/18 1413

## 2018-12-17 NOTE — ED Triage Notes (Signed)
Pt reports woke up around 0330 this morning with tightness in chest. Reports lasted approx 77min.  Reports felt ok this morning and went to work.  PT says was sitting at her computer at 0830 and chest tightness came back, she became hot, diaphoretic, nauseated, and lightheaded.  PT says she noticed a puddle in the floor and was unsure if she had been incontinent of urine.  Pt says she was "soaked" from her waste down.  Pt says she did not pass completely out.  Pt presently taking prednisone for arthritis.  ALso recently stopped her bp medication because it makes her dizzy.

## 2018-12-23 DIAGNOSIS — R079 Chest pain, unspecified: Secondary | ICD-10-CM | POA: Diagnosis not present

## 2018-12-23 DIAGNOSIS — M542 Cervicalgia: Secondary | ICD-10-CM | POA: Diagnosis not present

## 2018-12-24 ENCOUNTER — Other Ambulatory Visit: Payer: Self-pay

## 2018-12-24 ENCOUNTER — Other Ambulatory Visit (HOSPITAL_COMMUNITY): Payer: Self-pay | Admitting: Internal Medicine

## 2018-12-24 ENCOUNTER — Other Ambulatory Visit: Payer: Self-pay | Admitting: Internal Medicine

## 2018-12-24 DIAGNOSIS — R200 Anesthesia of skin: Secondary | ICD-10-CM

## 2018-12-29 ENCOUNTER — Ambulatory Visit (HOSPITAL_COMMUNITY): Payer: Medicare HMO

## 2019-01-01 ENCOUNTER — Ambulatory Visit: Payer: Medicare HMO | Admitting: Cardiology

## 2019-01-01 ENCOUNTER — Other Ambulatory Visit: Payer: Medicare HMO

## 2019-01-01 ENCOUNTER — Encounter: Payer: Self-pay | Admitting: Cardiology

## 2019-01-01 ENCOUNTER — Telehealth: Payer: Self-pay | Admitting: Cardiology

## 2019-01-01 ENCOUNTER — Other Ambulatory Visit: Payer: Self-pay

## 2019-01-01 VITALS — BP 148/97 | HR 106 | Ht 63.0 in | Wt 176.4 lb

## 2019-01-01 DIAGNOSIS — R011 Cardiac murmur, unspecified: Secondary | ICD-10-CM | POA: Diagnosis not present

## 2019-01-01 DIAGNOSIS — R079 Chest pain, unspecified: Secondary | ICD-10-CM

## 2019-01-01 NOTE — Progress Notes (Signed)
Clinical Summary Ms. Dekker is a 68 y.o.female seen as new patient for the following medical problems.     1. Chest pain/epigastric pain - seen in ER 12/17/2018 with chest pain - trop not dectectable x 2. CXR no acute process. EKG SR, no acute ischemic changes   - woke up in middle night with epigastric. Aching like pain, 2/10 in severity. Lasted few minutes, went back to sleep. Felt fine in the AM with waking up. At work while sitting at desk similar feeling. +nausea. Lost control of bladder. No palpitations. Felt fine just after a few minutes. Called pcp, went to ER   -she has had near faitning with pain or anxiety in the past - no recurrent symptom sinces.   - on prednisone at the time for neck pains - was on norvasc, pcp holding for now due to dizziness - sedentary lifestyle. Highest activity is housework, not symptos with exertion.     Past Medical History:  Diagnosis Date  . Arthritis   . DJD (degenerative joint disease)   . Hypertension   . Osteoarthritis   . PONV (postoperative nausea and vomiting)    after colonoscopy     No Known Allergies   Current Outpatient Medications  Medication Sig Dispense Refill  . amLODipine (NORVASC) 5 MG tablet Take 5 mg by mouth daily.    . predniSONE (DELTASONE) 10 MG tablet Take as directed per package instructions     No current facility-administered medications for this visit.     Past Surgical History:  Procedure Laterality Date  . CATARACT EXTRACTION W/PHACO Left 04/06/2013   Procedure: CATARACT EXTRACTION PHACO AND INTRAOCULAR LENS PLACEMENT (IOC);  Surgeon: Tonny Kiernan Atkerson, MD;  Location: [facility];  Service: Ophthalmology;  Laterality: Left;  CDE 13.72  . CATARACT EXTRACTION W/PHACO Right 04/16/2013   Procedure: CATARACT EXTRACTION PHACO AND INTRAOCULAR LENS PLACEMENT (IOC);  Surgeon: Tonny Jessenia Filippone, MD;  Location: [facility];  Service: Ophthalmology;  Laterality: Right;  CDE:15.88  . CESAREAN SECTION  1986  . COLONOSCOPY N/A  07/22/2015   Procedure: COLONOSCOPY;  Surgeon: Daneil Dolin, MD;  Location: [facility];  Service: Endoscopy;  Laterality: N/A;  0830  . JOINT REPLACEMENT Right 2010   hip     No Known Allergies    Family History  Problem Relation Age of Onset  . Colon cancer Neg Hx      Social History Ms. Eckroth reports that she has never smoked. She has never used smokeless tobacco. Ms. Myrtle reports no history of alcohol use.   Review of Systems CONSTITUTIONAL: No weight loss, fever, chills, weakness or fatigue.  HEENT: Eyes: No visual loss, blurred vision, double vision or yellow sclerae.No hearing loss, sneezing, congestion, runny nose or sore throat.  SKIN: No rash or itching.  CARDIOVASCULAR: per hpi RESPIRATORY: No shortness of breath, cough or sputum.  GASTROINTESTINAL: No anorexia, nausea, vomiting or diarrhea. No abdominal pain or blood.  GENITOURINARY: No burning on urination, no polyuria NEUROLOGICAL: No headache, dizziness, syncope, paralysis, ataxia, numbness or tingling in the extremities. No change in bowel or bladder control.  MUSCULOSKELETAL: No muscle, back pain, joint pain or stiffness.  LYMPHATICS: No enlarged nodes. No history of splenectomy.  PSYCHIATRIC: No history of depression or anxiety.  ENDOCRINOLOGIC: No reports of sweating, cold or heat intolerance. No polyuria or polydipsia.  Marland Kitchen   Physical Examination Today's Vitals   01/01/19 0847 01/01/19 0854  BP: (!) 159/96 (!) 148/97  Pulse: (!) 114 (!) 106  SpO2: 99% 99%  Weight: 176 lb 6.4 oz (80 kg)   Height: 5\' 3"  (1.6 m)    Body mass index is 31.25 kg/m.  Gen: resting comfortably, no acute distress HEENT: no scleral icterus, pupils equal round and reactive, no palptable cervical adenopathy,  CV: RRR, 3/6 systolic murmur rusb, no jvd Resp: Clear to auscultation bilaterally GI: abdomen is soft, non-tender, non-distended, normal bowel sounds, no hepatosplenomegaly MSK: extremities are warm, no  edema.  Skin: warm, no rash Neuro:  no focal deficits Psych: appropriate affect     Assessment and Plan  1. Epigastric/chest pain - primarily epigastric pain, 2 isolated episodes 2 weeks ago without recurrence. Negative workup in the ER for ACS - obtain echo given heart murmur and symptoms, at this time do not see an indication for stress testing - mild tachycardia by dynamap, manual check HR 90. Looks to be due to anxiety.    2. Heart murmur - obtain echo      Arnoldo Lenis, M.D

## 2019-01-01 NOTE — Telephone Encounter (Signed)
  Precert needed for:  Echo   Location:  [facility]     Date: Jan 06, 2019

## 2019-01-01 NOTE — Patient Instructions (Signed)
Your physician recommends that you schedule a follow-up appointment in: PENDING WITH DR BRANCH  Your physician recommends that you continue on your current medications as directed. Please refer to the Current Medication list given to you today.  Your physician has requested that you have an echocardiogram. Echocardiography is a painless test that uses sound waves to create images of your heart. It provides your doctor with information about the size and shape of your heart and how well your heart's chambers and valves are working. This procedure takes approximately one hour. There are no restrictions for this procedure.  Thank you for choosing [facility]!!    

## 2019-01-06 ENCOUNTER — Ambulatory Visit (HOSPITAL_COMMUNITY)
Admission: RE | Admit: 2019-01-06 | Discharge: 2019-01-06 | Disposition: A | Discharge status: Home / Self Care | Payer: Medicare HMO | Source: Ambulatory Visit | Attending: Cardiology | Admitting: Cardiology

## 2019-01-06 ENCOUNTER — Ambulatory Visit (HOSPITAL_COMMUNITY): Payer: Medicare HMO

## 2019-01-06 ENCOUNTER — Other Ambulatory Visit: Payer: Self-pay

## 2019-01-06 DIAGNOSIS — R079 Chest pain, unspecified: Secondary | ICD-10-CM

## 2019-01-06 NOTE — Progress Notes (Signed)
*  PRELIMINARY RESULTS* Echocardiogram 2D Echocardiogram has been performed.  Leavy Cella 01/06/2019, 3:48 PM

## 2019-01-14 ENCOUNTER — Encounter (HOSPITAL_COMMUNITY): Payer: Self-pay

## 2019-01-14 ENCOUNTER — Ambulatory Visit (HOSPITAL_COMMUNITY): Payer: Medicare HMO

## 2019-01-16 HISTORY — PX: CERVICAL FUSION: SHX112

## 2019-01-19 ENCOUNTER — Telehealth: Payer: Self-pay | Admitting: *Deleted

## 2019-01-19 NOTE — Telephone Encounter (Signed)
-----   Message from Herminio Commons, MD sent at 01/07/2019  9:29 AM EST ----- Normal pumping function.  No valve issues.

## 2019-01-19 NOTE — Telephone Encounter (Signed)
4 month f/u scheduled

## 2019-01-19 NOTE — Telephone Encounter (Signed)
Pt voiced understanding - routed to pcp  

## 2019-01-19 NOTE — Telephone Encounter (Signed)
4 month f/u  JB

## 2019-01-22 DIAGNOSIS — Z6831 Body mass index (BMI) 31.0-31.9, adult: Secondary | ICD-10-CM | POA: Diagnosis not present

## 2019-01-22 DIAGNOSIS — I10 Essential (primary) hypertension: Secondary | ICD-10-CM | POA: Diagnosis not present

## 2019-01-22 DIAGNOSIS — M5412 Radiculopathy, cervical region: Secondary | ICD-10-CM | POA: Diagnosis not present

## 2019-02-03 DIAGNOSIS — I10 Essential (primary) hypertension: Secondary | ICD-10-CM | POA: Diagnosis not present

## 2019-02-03 DIAGNOSIS — E785 Hyperlipidemia, unspecified: Secondary | ICD-10-CM | POA: Diagnosis not present

## 2019-02-03 DIAGNOSIS — Z79899 Other long term (current) drug therapy: Secondary | ICD-10-CM | POA: Diagnosis not present

## 2019-02-03 DIAGNOSIS — K219 Gastro-esophageal reflux disease without esophagitis: Secondary | ICD-10-CM | POA: Diagnosis not present

## 2019-02-03 DIAGNOSIS — M1990 Unspecified osteoarthritis, unspecified site: Secondary | ICD-10-CM | POA: Diagnosis not present

## 2019-02-09 DIAGNOSIS — R079 Chest pain, unspecified: Secondary | ICD-10-CM | POA: Diagnosis not present

## 2019-02-09 DIAGNOSIS — E785 Hyperlipidemia, unspecified: Secondary | ICD-10-CM | POA: Diagnosis not present

## 2019-02-09 DIAGNOSIS — M542 Cervicalgia: Secondary | ICD-10-CM | POA: Diagnosis not present

## 2019-02-09 DIAGNOSIS — R7309 Other abnormal glucose: Secondary | ICD-10-CM | POA: Diagnosis not present

## 2019-02-09 DIAGNOSIS — I10 Essential (primary) hypertension: Secondary | ICD-10-CM | POA: Diagnosis not present

## 2019-02-17 ENCOUNTER — Other Ambulatory Visit (HOSPITAL_COMMUNITY): Payer: Self-pay | Admitting: Student

## 2019-02-17 ENCOUNTER — Other Ambulatory Visit: Payer: Self-pay | Admitting: Student

## 2019-02-17 DIAGNOSIS — M5412 Radiculopathy, cervical region: Secondary | ICD-10-CM

## 2019-02-26 ENCOUNTER — Ambulatory Visit: Payer: Medicare HMO

## 2019-02-27 ENCOUNTER — Other Ambulatory Visit: Payer: Self-pay

## 2019-02-27 ENCOUNTER — Ambulatory Visit (HOSPITAL_COMMUNITY)
Admission: RE | Admit: 2019-02-27 | Discharge: 2019-02-27 | Disposition: A | Discharge status: Home / Self Care | Payer: Medicare HMO | Source: Ambulatory Visit | Attending: Student | Admitting: Student

## 2019-02-27 DIAGNOSIS — M5412 Radiculopathy, cervical region: Secondary | ICD-10-CM

## 2019-05-22 ENCOUNTER — Ambulatory Visit: Payer: Medicare HMO | Admitting: Cardiology

## 2019-05-22 ENCOUNTER — Encounter: Payer: Self-pay | Admitting: Cardiology

## 2019-05-22 ENCOUNTER — Other Ambulatory Visit: Payer: Self-pay

## 2019-05-22 VITALS — BP 150/100 | HR 88 | Ht 63.0 in | Wt 180.2 lb

## 2019-05-22 DIAGNOSIS — R011 Cardiac murmur, unspecified: Secondary | ICD-10-CM

## 2019-05-22 DIAGNOSIS — R0789 Other chest pain: Secondary | ICD-10-CM

## 2019-05-22 NOTE — Progress Notes (Signed)
Clinical Summary Nichole Brewer is a 69 y.o.female seen today for follow up of the following medical problems.     1. Chest pain/epigastric pain - seen in ER 12/17/2018 with chest pain - trop not dectectable x 2. CXR no acute process. EKG SR, no acute ischemic changes   - woke up in middle night with epigastric. Aching like pain, 2/10 in severity. Lasted few minutes, went back to sleep. Felt fine in the AM with waking up. At work while sitting at desk similar feeling. +nausea. Lost control of bladder. No palpitations. Felt fine just after a few minutes. Called pcp, went to ER   -she has had near faitning with pain or anxiety in the past - no recurrent symptom sinces.   - on prednisone at the time for neck pains - was on norvasc, pcp holding for now due to dizziness - sedentary lifestyle. Highest activity is housework, not symptos with exertion.      - 12/2018 echo LVEF 60-65%, no significant pathology - symptoms have resolved - had cervical fusion in February, unclear if was related to chest pains.        2 Elevated bp/white coat HTN - she reports history of white coat HTN - followed by pcp, upcoming appt - home bp's 135/78   Had covid vaccine x 2 Past Medical History:  Diagnosis Date  . Arthritis   . DJD (degenerative joint disease)   . Hypertension   . Osteoarthritis   . PONV (postoperative nausea and vomiting)    after colonoscopy     No Known Allergies   No current outpatient medications on file.   No current facility-administered medications for this visit.     Past Surgical History:  Procedure Laterality Date  . CATARACT EXTRACTION W/PHACO Left 04/06/2013   Procedure: CATARACT EXTRACTION PHACO AND INTRAOCULAR LENS PLACEMENT (IOC);  Surgeon: Tonny Sloane Junkin, MD;  Location: [facility];  Service: Ophthalmology;  Laterality: Left;  CDE 13.72  . CATARACT EXTRACTION W/PHACO Right 04/16/2013   Procedure: CATARACT EXTRACTION PHACO AND INTRAOCULAR LENS  PLACEMENT (IOC);  Surgeon: Tonny Jacquetta Polhamus, MD;  Location: [facility];  Service: Ophthalmology;  Laterality: Right;  CDE:15.88  . CESAREAN SECTION  1986  . COLONOSCOPY N/A 07/22/2015   Procedure: COLONOSCOPY;  Surgeon: Daneil Dolin, MD;  [facility];  Service: Endoscopy;  Laterality: N/A;  0830  . JOINT REPLACEMENT Right 2010   hip     No Known Allergies    Family History  Problem Relation Age of Onset  . Hypertension Mother   . Arthritis Mother   . Alzheimer's disease Father   . Lung cancer Father   . CAD Brother        stent placement  . Colon cancer Neg Hx      Social History Nichole Brewer reports that she has never smoked. She has never used smokeless tobacco. Nichole Brewer reports no history of alcohol use.   Review of Systems CONSTITUTIONAL: No weight loss, fever, chills, weakness or fatigue.  HEENT: Eyes: No visual loss, blurred vision, double vision or yellow sclerae.No hearing loss, sneezing, congestion, runny nose or sore throat.  SKIN: No rash or itching.  CARDIOVASCULAR: per hpi RESPIRATORY: No shortness of breath, cough or sputum.  GASTROINTESTINAL: No anorexia, nausea, vomiting or diarrhea. No abdominal pain or blood.  GENITOURINARY: No burning on urination, no polyuria NEUROLOGICAL: No headache, dizziness, syncope, paralysis, ataxia, numbness or tingling in the extremities. No change in bowel or bladder control.  MUSCULOSKELETAL: No muscle, back pain, joint pain or stiffness.  LYMPHATICS: No enlarged nodes. No history of splenectomy.  PSYCHIATRIC: No history of depression or anxiety.  ENDOCRINOLOGIC: No reports of sweating, cold or heat intolerance. No polyuria or polydipsia.  Marland Kitchen   Physical Examination Today's Vitals   05/22/19 1252  BP: (!) 150/100  Pulse: 88  SpO2: 98%  Weight: 180 lb 3.2 oz (81.7 kg)  Height: 5\' 3"  (1.6 m)   Body mass index is 31.92 kg/m.  Gen: resting comfortably, no acute distress HEENT: no scleral icterus, pupils  equal round and reactive, no palptable cervical adenopathy,  CV: RRR, 2/6 systolic murmur rusb, no jvd Resp: Clear to auscultation bilaterally GI: abdomen is soft, non-tender, non-distended, normal bowel sounds, no hepatosplenomegaly MSK: extremities are warm, no edema.  Skin: warm, no rash Neuro:  no focal deficits Psych: appropriate affect   Diagnostic Studies  IMPRESSIONS    1. Left ventricular ejection fraction, by visual estimation, is 60 to  65%. The left ventricle has normal function. Left ventricular septal wall  thickness was mildly increased. There is no left ventricular hypertrophy.  2. The left ventricle has no regional wall motion abnormalities.  3. Global right ventricle has normal systolic function.The right  ventricular size is normal. No increase in right ventricular wall  thickness.  4. Left atrial size was normal.  5. Right atrial size was normal.  6. The mitral valve is normal in structure. No evidence of mitral valve  regurgitation. No evidence of mitral stenosis.  7. The tricuspid valve is normal in structure. Tricuspid valve  regurgitation is not demonstrated.  8. The aortic valve is normal in structure. Aortic valve regurgitation is  not visualized. No evidence of aortic valve sclerosis or stenosis.  9. The pulmonic valve was normal in structure. Pulmonic valve  regurgitation is not visualized.  10. The inferior vena cava is normal in size with greater than 50%  respiratory variability, suggesting right atrial pressure of 3 mmHg.  11. The interatrial septum was not well visualized.    Assessment and Plan  1. Epigastric/chest pain - primarily epigastric pain, 2 isolated episodes 2 weeks ago without recurrence. Negative workup in the ER for ACS -symptoms have resolved, not consistent with cardiac etiology - no further workup   2. Heart murmur - mild on exam, benign echo. No further workup  3. White coat HTN - elevaetd in clniic, home  numbers at goal. Monitor.     F/u as needed  Arnoldo Lenis, M.D., F.A.C.C.

## 2019-05-22 NOTE — Patient Instructions (Signed)
Medication Instructions:  Continue all current medications.  Labwork: none  Testing/Procedures: none  Follow-Up: As needed.    Any Other Special Instructions Will Be Listed Below (If Applicable).  If you need a refill on your cardiac medications before your next appointment, please call your pharmacy.  

## 2019-08-20 ENCOUNTER — Other Ambulatory Visit: Payer: Self-pay | Admitting: Podiatry

## 2019-08-20 ENCOUNTER — Ambulatory Visit (INDEPENDENT_AMBULATORY_CARE_PROVIDER_SITE_OTHER): Payer: Medicare HMO | Admitting: Podiatry

## 2019-08-20 ENCOUNTER — Ambulatory Visit (INDEPENDENT_AMBULATORY_CARE_PROVIDER_SITE_OTHER): Payer: Medicare HMO

## 2019-08-20 ENCOUNTER — Other Ambulatory Visit: Payer: Self-pay

## 2019-08-20 ENCOUNTER — Encounter: Payer: Self-pay | Admitting: Podiatry

## 2019-08-20 VITALS — Temp 97.8°F

## 2019-08-20 DIAGNOSIS — G629 Polyneuropathy, unspecified: Secondary | ICD-10-CM

## 2019-08-20 DIAGNOSIS — M779 Enthesopathy, unspecified: Secondary | ICD-10-CM | POA: Diagnosis not present

## 2019-08-20 DIAGNOSIS — M79672 Pain in left foot: Secondary | ICD-10-CM

## 2019-08-20 DIAGNOSIS — E2839 Other primary ovarian failure: Secondary | ICD-10-CM | POA: Insufficient documentation

## 2019-08-20 DIAGNOSIS — N951 Menopausal and female climacteric states: Secondary | ICD-10-CM | POA: Insufficient documentation

## 2019-08-20 DIAGNOSIS — M79671 Pain in right foot: Secondary | ICD-10-CM

## 2019-08-20 DIAGNOSIS — R319 Hematuria, unspecified: Secondary | ICD-10-CM | POA: Insufficient documentation

## 2019-08-20 NOTE — Patient Instructions (Signed)

## 2019-08-20 NOTE — Progress Notes (Signed)
Subjective:   Patient ID: Nichole Brewer, female   DOB: 69 y.o.   MRN: [id number]   HPI Patient presents stating that both feet and the forefoot feel hot and swollen at times with moderate discomfort. Patient states she is taken gabapentin in the past but it seemed to get her low back pain but it did seem to help with her feet and it seems like there is also some fluid in the joints. Patient does not smoke likes to be active and states is been hurting for around a year   Review of Systems  All other systems reviewed and are negative.       Objective:  Physical Exam Vitals and nursing note reviewed.  Constitutional:      Appearance: She is well-developed.  Pulmonary:     Effort: Pulmonary effort is normal.  Musculoskeletal:        General: Normal range of motion.  Skin:    General: Skin is warm.  Neurological:     Mental Status: She is alert.     Neurovascular status was found to be intact with muscle strength adequate. Patient has discomfort around the third and fourth MPJs bilateral localized to these areas and does have mild indications of neuropathy. She has structural bunion deformity left over right but nonpainful currently. Has good digital perfusion well oriented x3     Assessment:  Inflammatory capsulitis bilateral with structural bunion left over right     Plan:  H&P reviewed condition and I did try to put some medicine around the joints 3 mg Dexasone Kenalog 19M Xylocaine bilateral. I then discussed trying gabapentin again but only at night and it may be should be able to tolerate it and she will consider this and decide what she wants to do. I recommended soft type shoes and will be seen back as needed  X-rays indicate no signs of stress fracture with significant bunion deformity left over right

## 2020-01-25 ENCOUNTER — Other Ambulatory Visit (HOSPITAL_COMMUNITY): Payer: Self-pay | Admitting: Neurosurgery

## 2020-01-25 DIAGNOSIS — M5416 Radiculopathy, lumbar region: Secondary | ICD-10-CM

## 2020-02-04 ENCOUNTER — Ambulatory Visit (HOSPITAL_COMMUNITY)
Admission: RE | Admit: 2020-02-04 | Discharge: 2020-02-04 | Disposition: A | Discharge status: Home / Self Care | Payer: Medicare HMO | Source: Ambulatory Visit | Attending: Neurosurgery | Admitting: Neurosurgery

## 2020-02-04 ENCOUNTER — Other Ambulatory Visit: Payer: Self-pay

## 2020-02-04 DIAGNOSIS — M5416 Radiculopathy, lumbar region: Secondary | ICD-10-CM | POA: Insufficient documentation

## 2020-02-04 MED ORDER — GADOBUTROL 1 MMOL/ML IV SOLN
7.5000 mL | Freq: Once | INTRAVENOUS | Status: AC | PRN
  Administered 2020-02-04: 7.5 mL via INTRAVENOUS

## 2020-03-10 ENCOUNTER — Telehealth: Payer: Self-pay | Admitting: Nurse Practitioner

## 2020-03-10 NOTE — Telephone Encounter (Signed)
Pt was referred here from Dr. Willey Blade and she denied being seen here at this time. Shredded referral and faxed pcp to inform

## 2020-04-09 ENCOUNTER — Ambulatory Visit
Admission: RE | Admit: 2020-04-09 | Discharge: 2020-04-09 | Disposition: A | Discharge status: Home / Self Care | Payer: Medicare HMO | Source: Ambulatory Visit | Attending: Physician Assistant | Admitting: Physician Assistant

## 2020-04-09 ENCOUNTER — Other Ambulatory Visit: Payer: Self-pay

## 2020-04-09 VITALS — BP 167/84 | HR 114 | Temp 98.8°F | Resp 17

## 2020-04-09 DIAGNOSIS — J029 Acute pharyngitis, unspecified: Secondary | ICD-10-CM | POA: Insufficient documentation

## 2020-04-09 LAB — POCT RAPID STREP A (OFFICE): Rapid Strep A Screen: NEGATIVE

## 2020-04-09 NOTE — ED Provider Notes (Signed)
[facility]    CSN: [id number] Arrival date & time: 04/09/20  0835      History   Chief Complaint Chief Complaint  Patient presents with  . Sore Throat    HPI Nichole Brewer is a 70 y.o. female.   Presents with a sore throat that started yesterday. No fever, chills, congestion or cough. Home Covid test was normal. Wanted to get checked out. No known exposures.      Past Medical History:  Diagnosis Date  . Arthritis   . DJD (degenerative joint disease)   . Hypertension   . Osteoarthritis   . PONV (postoperative nausea and vomiting)    after colonoscopy    Patient Active Problem List   Diagnosis Date Noted  . Blood in urine 08/20/2019  . Decreased estrogen level 08/20/2019  . Menopausal syndrome 08/20/2019  . Herniation of lumbar intervertebral disc with radiculopathy 12/22/2015  . Spinal stenosis of lumbar region with neurogenic claudication 11/30/2015  . Post-operative nausea and vomiting   . Encounter for screening colonoscopy 06/29/2015  . PONV (postoperative nausea and vomiting) 06/29/2015    Past Surgical History:  Procedure Laterality Date  . CATARACT EXTRACTION W/PHACO Left 04/06/2013   Procedure: CATARACT EXTRACTION PHACO AND INTRAOCULAR LENS PLACEMENT (IOC);  Surgeon: Tonny Branch, MD;  Location: [facility];  Service: Ophthalmology;  Laterality: Left;  CDE 13.72  . CATARACT EXTRACTION W/PHACO Right 04/16/2013   Procedure: CATARACT EXTRACTION PHACO AND INTRAOCULAR LENS PLACEMENT (IOC);  Surgeon: Tonny Branch, MD;  Location: [facility];  Service: Ophthalmology;  Laterality: Right;  CDE:15.88  . CERVICAL FUSION    . CESAREAN SECTION  1986  . COLONOSCOPY N/A 07/22/2015   Procedure: COLONOSCOPY;  Surgeon: Daneil Dolin, MD;  Location: [facility];  Service: Endoscopy;  Laterality: N/A;  0830  . JOINT REPLACEMENT Right 2010   hip    OB History   No obstetric history on file.      Home Medications    Prior to Admission medications   Medication  Sig Start Date End Date Taking? Authorizing Provider  diclofenac (VOLTAREN) 75 MG EC tablet Take 75 mg by mouth as needed.    [provider]    Family History Family History  Problem Relation Age of Onset  . Hypertension Mother   . Arthritis Mother   . Alzheimer's disease Father   . Lung cancer Father   . CAD Brother        stent placement  . Colon cancer Neg Hx     Social History Social History   Tobacco Use  . Smoking status: Never Smoker  . Smokeless tobacco: Never Used  Substance Use Topics  . Alcohol use: No    Alcohol/week: 0.0 standard drinks  . Drug use: No     Allergies   Gabapentin   Review of Systems Review of Systems  All other systems reviewed and are negative.    Physical Exam Triage Vital Signs ED Triage Vitals [04/09/20 0843]  Enc Vitals Group     BP (!) 167/84     Pulse Rate (!) 114     Resp 17     Temp 98.8 F (37.1 C)     Temp Source Oral     SpO2 96 %     Weight      Height      Head Circumference      Peak Flow      Pain Score      Pain  Loc      Pain Edu?      Excl. in Lane?    No data found.  Updated Vital Signs BP (!) 167/84 (BP Location: Right Arm)   Pulse (!) 114   Temp 98.8 F (37.1 C) (Oral)   Resp 17   SpO2 96%   Visual Acuity Right Eye Distance:   Left Eye Distance:   Bilateral Distance:    Right Eye Near:   Left Eye Near:    Bilateral Near:     Physical Exam Vitals and nursing note reviewed.  Constitutional:      Appearance: She is well-developed.  HENT:     Mouth/Throat:     Mouth: Mucous membranes are moist.     Pharynx: Posterior oropharyngeal erythema present. No pharyngeal swelling, oropharyngeal exudate or uvula swelling.     Tonsils: No tonsillar exudate or tonsillar abscesses.  Cardiovascular:     Rate and Rhythm: Regular rhythm.  Pulmonary:     Effort: Pulmonary effort is normal.     Breath sounds: Normal breath sounds.  Musculoskeletal:     Cervical back: Normal range of  motion.  Lymphadenopathy:     Cervical: No cervical adenopathy.  Neurological:     Mental Status: She is alert.      UC Treatments / Results  Labs (all labs ordered are listed, but only abnormal results are displayed) Labs Reviewed - No data to display  EKG   Radiology No results found.  Procedures Procedures (including critical care time)  Medications Ordered in UC Medications - No data to display  Initial Impression / Assessment and Plan / UC Course  I have reviewed the triage vital signs and the nursing notes.  Pertinent labs & imaging results that were available during my care of the patient were reviewed by me and considered in my medical decision making (see chart for details).    Strep and Covid negative. Viral in presentation.  Final Clinical Impressions(s) / UC Diagnoses   Final diagnoses:  None   Discharge Instructions   None    ED Prescriptions    None     PDMP not reviewed this encounter.   Bjorn Pippin, [place] 04/09/20 [phone]

## 2020-04-09 NOTE — ED Triage Notes (Signed)
Sore throat that started yesterday, had neg home covid test.

## 2020-04-09 NOTE — Discharge Instructions (Addendum)
Treat with supportive care. Fluids and rest. FU as needed.

## 2020-04-12 LAB — CULTURE, GROUP A STREP (THRC)

## 2023-03-12 ENCOUNTER — Encounter: Payer: Self-pay | Admitting: Diagnostic Neuroimaging

## 2023-03-12 ENCOUNTER — Ambulatory Visit: Payer: Medicare HMO | Admitting: Diagnostic Neuroimaging

## 2023-03-12 VITALS — BP 150/90 | HR 91 | Ht 63.0 in | Wt 187.2 lb

## 2023-03-12 DIAGNOSIS — G629 Polyneuropathy, unspecified: Secondary | ICD-10-CM | POA: Diagnosis not present

## 2023-03-12 MED ORDER — GABAPENTIN 100 MG PO CAPS: 100.0000 mg | ORAL_CAPSULE | Freq: Every day | ORAL | 6 refills | Status: DC

## 2023-03-12 NOTE — Progress Notes (Signed)
 [facility]  PATIENT: Nichole Brewer DOB: 16-Jan-1950  REFERRING CLINICIAN: Carylon Perches, MD HISTORY FROM: patient  REASON FOR VISIT: new consult   HISTORICAL  CHIEF COMPLAINT:  Chief Complaint  Patient presents with   New Patient (Initial Visit)    Pt in room 7 alone. New patient paper referral for peripheral neuropathy. Pt said bilateral burning/tingling in feet for several years. Pt also has bilateral tingling in hands is intermittent.    HISTORY OF PRESENT ILLNESS:   73 year old female here for evaluation of numbness and tingling.  For past 10 years patient has had numbness tingling, sensations in her toes and feet up to her shins.  Has some intermittent sensation in her fingertips.  Has some history of lumbar and cervical spine surgeries in the past.  Referred here for neuropathy workup.   REVIEW OF SYSTEMS: Full 14 system review of systems performed and negative with exception of: as per HPI.  ALLERGIES: Allergies  Allergen Reactions   Gabapentin     Pt stated, "This made my lower back hurt, but did help with burning sensation in feet"    HOME MEDICATIONS: Outpatient Medications Prior to Visit  Medication Sig Dispense Refill   diclofenac (VOLTAREN) 75 MG EC tablet Take 75 mg by mouth as needed.     Rosuvastatin Calcium (CRESTOR PO) Take 25 mg by mouth in the morning, at noon, and at bedtime.     traMADol (ULTRAM) 50 MG tablet Take 50 mg by mouth every 6 (six) hours as needed. Take as needed for lower back pain     rosuvastatin (CRESTOR) 10 MG tablet Take 10 mg by mouth 3 (three) times a week.     No facility-administered medications prior to visit.    PAST MEDICAL HISTORY: Past Medical History:  Diagnosis Date   Arthritis    DJD (degenerative joint disease)    Hypertension    Osteoarthritis    PONV (postoperative nausea and vomiting)    after colonoscopy    PAST SURGICAL HISTORY: Past Surgical History:  Procedure Laterality Date    CATARACT EXTRACTION W/PHACO Left 04/06/2013   Procedure: CATARACT EXTRACTION PHACO AND INTRAOCULAR LENS PLACEMENT (IOC);  Surgeon: Gemma Payor, MD;  Location: [facility];  Service: Ophthalmology;  Laterality: Left;  CDE 13.72   CATARACT EXTRACTION W/PHACO Right 04/16/2013   Procedure: CATARACT EXTRACTION PHACO AND INTRAOCULAR LENS PLACEMENT (IOC);  Surgeon: Gemma Payor, MD;  Location: [facility];  Service: Ophthalmology;  Laterality: Right;  CDE:15.88   CERVICAL FUSION     CESAREAN SECTION  1986   COLONOSCOPY N/A 07/22/2015   Procedure: COLONOSCOPY;  Surgeon: Corbin Ade, MD;  Location: [facility];  Service: Endoscopy;  Laterality: N/A;  0830   JOINT REPLACEMENT Right 2010   hip    FAMILY HISTORY: Family History  Problem Relation Age of Onset   Hypertension Mother    Arthritis Mother    Alzheimer's disease Father    Lung cancer Father    CAD Brother        stent placement   Colon cancer Neg Hx     SOCIAL HISTORY: Social History   Socioeconomic History   Marital status: Divorced    Spouse name: Not on file   Number of children: 3   Years of education: Not on file   Highest education level: Not on file  Occupational History   Not on file  Tobacco Use   Smoking status: Never   Smokeless tobacco: Never  Vaping  Use   Vaping status: Never Used  Substance and Sexual Activity   Alcohol use: No    Alcohol/week: 0.0 standard drinks of alcohol   Drug use: No   Sexual activity: Not on file  Other Topics Concern   Not on file  Social History Narrative   Right handed    Drinks diet pepsi ( 2 glasses out of 2 liter)    Pt had cataract surgery       Pt daughter lives with her and her 3 children.   Social Drivers of Corporate investment banker Strain: Not on file  Food Insecurity: Not on file  Transportation Needs: Not on file  Physical Activity: Not on file  Stress: Not on file  Social Connections: Not on file  Intimate Partner Violence: Not on file     PHYSICAL  EXAM  GENERAL EXAM/CONSTITUTIONAL: Vitals:  Vitals:   03/12/23 1112 03/12/23 1123  BP: (!) 184/106 (!) 150/90  Pulse: 91   Weight: 187 lb 3.2 oz (84.9 kg)   Height: 5\' 3"  (1.6 m)    Body mass index is 33.16 kg/m. Wt Readings from Last 3 Encounters:  03/12/23 187 lb 3.2 oz (84.9 kg)  05/22/19 180 lb 3.2 oz (81.7 kg)  01/01/19 176 lb 6.4 oz (80 kg)   Patient is in no distress; well developed, nourished and groomed; neck is supple  CARDIOVASCULAR: Examination of carotid arteries is normal; no carotid bruits Regular rate and rhythm, no murmurs Examination of peripheral vascular system by observation and palpation is normal  EYES: Ophthalmoscopic exam of optic discs and posterior segments is normal; no papilledema or hemorrhages No results found.  MUSCULOSKELETAL: Gait, strength, tone, movements noted in Neurologic exam below  NEUROLOGIC: MENTAL STATUS:      No data to display         awake, alert, oriented to person, place and time recent and remote memory intact normal attention and concentration language fluent, comprehension intact, naming intact fund of knowledge appropriate  CRANIAL NERVE:  2nd - no papilledema on fundoscopic exam 2nd, 3rd, 4th, 6th - pupils equal and reactive to light, visual fields full to confrontation, extraocular muscles intact, no nystagmus 5th - facial sensation symmetric 7th - facial strength symmetric 8th - hearing intact 9th - palate elevates symmetrically, uvula midline 11th - shoulder shrug symmetric 12th - tongue protrusion midline  MOTOR:  normal bulk and tone, full strength in the BUE, BLE  SENSORY:  normal and symmetric to light touch, pinprick, temperature, vibration; EXCEPT SLIGHT DECR VIB IN LEFT FOOT AND DECR TEMP IN BILATERAL FEET  COORDINATION:  finger-nose-finger, fine finger movements normal  REFLEXES:  deep tendon reflexes TRACE and symmetric  GAIT/STATION:  narrow based gait     DIAGNOSTIC DATA  (LABS, IMAGING, TESTING) - I reviewed patient records, labs, notes, testing and imaging myself where available.  Lab Results  Component Value Date   WBC 12.1 (H) 12/17/2018   HGB 12.6 12/17/2018   HCT 39.3 12/17/2018   MCV 96.1 12/17/2018   PLT 268 12/17/2018      Component Value Date/Time   NA 136 12/17/2018 0950   K 3.5 12/17/2018 0950   CL 102 12/17/2018 0950   CO2 22 12/17/2018 0950   GLUCOSE 122 (H) 12/17/2018 0950   BUN 20 12/17/2018 0950   CREATININE 0.71 12/17/2018 0950   CALCIUM 8.9 12/17/2018 0950   PROT 7.1 07/21/2008 1243   ALBUMIN 4.4 07/21/2008 1243   AST 22 07/21/2008 1243  ALT 31 07/21/2008 1243   ALKPHOS 87 07/21/2008 1243   BILITOT 1.0 07/21/2008 1243   GFRNONAA >60 12/17/2018 0950   GFRAA >60 12/17/2018 0950   No results found for: "CHOL", "HDL", "LDLCALC", "LDLDIRECT", "TRIG", "CHOLHDL" No results found for: "HGBA1C" No results found for: "VITAMINB12" No results found for: "TSH"   02/04/20 MRI lumbar spine 1. Postoperative changes from prior right hemi laminectomy at L4-5 with improved/resolved spinal stenosis. 2. Small right foraminal disc protrusion at L4-5, contacting and potentially affecting the exiting right L4 nerve root. This is new and/or increased from previous. 3. Suspected 7 mm synovial cyst at the anteromedial aspect of the right L4-5 facet, closely approximating and potentially irritating the descending right L5 nerve root. 4. Additional multilevel degenerative spondylosis and facet arthrosis elsewhere within the lumbar spine as detailed above, otherwise relatively stable.    ASSESSMENT AND PLAN  73 y.o. year old female here with:   Dx:  1. Neuropathy       PLAN:  MILD PROGRESSIVE NUMBNESS / BURNING IN FEET (since ~2015; suspect mild neuropathy) - check neuropathy labs - trial of gabapentin 100-300mg  at bedtime  Orders Placed This Encounter  Procedures   Vitamin B12   SPEP with IFE   ANA w/Reflex   SSA, SSB    Vitamin B1   Vitamin B6   Meds ordered this encounter  Medications   gabapentin (NEURONTIN) 100 MG capsule    Sig: Take 1-3 capsules (100-300 mg total) by mouth at bedtime.    Dispense:  90 capsule    Refill:  6   Return for pending if symptoms worsen or fail to improve, pending test results.    Suanne Marker, MD 03/12/2023, 11:53 AM Certified in Neurology, Neurophysiology and Neuroimaging  [facility] [address] [place], [place] [address] [phone]

## 2023-03-18 DIAGNOSIS — Z1211 Encounter for screening for malignant neoplasm of colon: Secondary | ICD-10-CM

## 2023-03-18 DIAGNOSIS — N951 Menopausal and female climacteric states: Secondary | ICD-10-CM

## 2023-03-18 DIAGNOSIS — E2839 Other primary ovarian failure: Secondary | ICD-10-CM

## 2023-03-18 DIAGNOSIS — M5116 Intervertebral disc disorders with radiculopathy, lumbar region: Secondary | ICD-10-CM

## 2023-03-18 DIAGNOSIS — M48062 Spinal stenosis, lumbar region with neurogenic claudication: Secondary | ICD-10-CM

## 2023-03-18 DIAGNOSIS — Z9889 Other specified postprocedural states: Secondary | ICD-10-CM

## 2023-03-19 LAB — ENA+DNA/DS+ANTICH+CENTRO+FA...
Anti JO-1: <0.2 AI (ref 0.0–0.9)
Antiribosomal P Antibodies: <0.2 AI (ref 0.0–0.9)
Centromere Ab Screen: <0.2 AI (ref 0.0–0.9)
Chromatin Ab SerPl-aCnc: <0.2 AI (ref 0.0–0.9)
ENA RNP Ab: <0.2 AI (ref 0.0–0.9)
ENA SM Ab Ser-aCnc: <0.2 AI (ref 0.0–0.9)
Homogeneous Pattern: 1:80 {titer}
Scleroderma (Scl-70) (ENA) Antibody, IgG: <0.2 AI (ref 0.0–0.9)
Smith/RNP Antibodies: <0.2 AI (ref 0.0–0.9)
Speckled Pattern: 1:160 {titer} — ABNORMAL HIGH
dsDNA Ab: <1 [IU]/mL (ref 0–9)

## 2023-03-19 LAB — VITAMIN B12: Vitamin B-12: 223 pg/mL — ABNORMAL LOW (ref 232–1245)

## 2023-03-19 LAB — MULTIPLE MYELOMA PANEL, SERUM
Albumin SerPl Elph-Mcnc: 4 g/dL (ref 2.9–4.4)
Albumin/Glob SerPl: 1.5 (ref 0.7–1.7)
Alpha 1: 0.2 g/dL (ref 0.0–0.4)
Alpha2 Glob SerPl Elph-Mcnc: 0.7 g/dL (ref 0.4–1.0)
B-Globulin SerPl Elph-Mcnc: 1.1 g/dL (ref 0.7–1.3)
Gamma Glob SerPl Elph-Mcnc: 0.8 g/dL (ref 0.4–1.8)
Globulin, Total: 2.8 g/dL (ref 2.2–3.9)
IgA/Immunoglobulin A, Serum: 155 mg/dL (ref 64–422)
IgG (Immunoglobin G), Serum: 909 mg/dL (ref 586–1602)
IgM (Immunoglobulin M), Srm: 29 mg/dL (ref 26–217)
Total Protein: 6.8 g/dL (ref 6.0–8.5)

## 2023-03-19 LAB — VITAMIN B1: Thiamine: 113.5 nmol/L (ref 66.5–200.0)

## 2023-03-19 LAB — SJOGREN'S SYNDROME ANTIBODS(SSA + SSB)
ENA SSA (RO) Ab: 0.2 AI (ref 0.0–0.9)
ENA SSB (LA) Ab: >8 AI — ABNORMAL HIGH (ref 0.0–0.9)

## 2023-03-19 LAB — VITAMIN B6: Vitamin B6: 11.6 ug/L (ref 3.4–65.2)

## 2023-03-19 LAB — ANA W/REFLEX

## 2023-03-26 ENCOUNTER — Telehealth: Payer: Self-pay | Admitting: Diagnostic Neuroimaging

## 2023-03-26 NOTE — Telephone Encounter (Signed)
Referral for Rheumatology fax to [facility]. Phone:[phone], Fax: [phone].

## 2023-03-26 NOTE — Telephone Encounter (Signed)
Referral for rheumatology fax to [facility]. Phone: [phone], Fax: [phone]

## 2023-09-07 NOTE — Progress Notes (Signed)
 COVID Vaccine received:  []  No [x]  Yes Date of any COVID positive Test in last 90 days:  none  PCP - Gaither Langton, MD [phone]  Cardiologist - Dorn Ross, MD Pain Mgmt- Charlie Dolores, MD  Chest x-ray - 01-04-2019  2v  Epic EKG - 12-18-2018   Repeat at PST  Stress Test -  ECHO - 01-06-2019  Epic Cardiac Cath -  CT Coronary Calcium score:   Pacemaker / ICD device [x]  No []  Yes   Spinal Cord Stimulator:[x]  No []  Yes       History of Sleep Apnea? [x]  No []  Yes   CPAP used?- [x]  No []  Yes    Patient has: [x]  NO Hx DM   []  Pre-DM   []  DM1  []   DM2 Does the patient monitor blood sugar?   [x]  N/A   []  No []  Yes   Blood Thinner / Instructions:   none Aspirin  Instructions:  none  ERAS Protocol Ordered: []  No  [x]  Yes PRE-SURGERY [x]  ENSURE  []  G2   Patient is to be NPO after: 0700  Dental hx: []  Dentures:  [x]  N/A      []  Bridge or Partial:                   []  Loose or Damaged teeth:   Comments: Patient was given the 5 CHG shower / bath instructions for THA  arthroplasty surgery along with 2 bottles of the CHG soap. Patient will start this on:   09-15-23           Activity level: Able to walk up 2 flights of stairs without becoming significantly short of breath or having chest pain?  [x]  No SOB and severe Leg pain   []    Yes  Patient can perform ADLs without assistance. []  No   [x]   Yes  Anesthesia review: HTN, Peripheral neuropathy (hands and feet), scoliosis, ACDF ?levels,  PONV   Patient denies any S&S of respiratory illness or Covid - no shortness of breath, fever, cough or chest pain at PAT appointment.  Patient verbalized understanding and agreement to the Pre-Surgical Instructions that were given to them at this PAT appointment. Patient was also educated of the need to review these PAT instructions again prior to her surgery.I reviewed the appropriate phone numbers to call if they have any and questions or concerns.

## 2023-09-07 NOTE — Patient Instructions (Signed)
 SURGICAL WAITING ROOM VISITATION Patients having surgery or a procedure may have no more than 2 support people in the waiting area - these visitors may rotate in the visitor waiting room.   If the patient needs to stay at the hospital during part of their recovery, the visitor guidelines for inpatient rooms apply.  PRE-OP VISITATION  Pre-op nurse will coordinate an appropriate time for 1 support person to accompany the patient in pre-op.  This support person may not rotate.  This visitor will be contacted when the time is appropriate for the visitor to come back in the pre-op area.  Please refer to the [facility] website for the visitor guidelines for Inpatients (after your surgery is over and you are in a regular room).  You are not required to quarantine at this time prior to your surgery. However, you must do this: Hand Hygiene often Do NOT share personal items Notify your provider if you are in close contact with someone who has COVID or you develop fever 100.4 or greater, new onset of sneezing, cough, sore throat, shortness of breath or body aches.  If you test positive for Covid or have been in contact with anyone that has tested positive in the last 10 days please notify you surgeon.    Your procedure is scheduled on:  THURSDAY  September 19, 2023  Report to [facility] Main Entrance: [place] entrance where the [employer] is available.   Report to admitting at:  07:45   AM  Call this number if you have any questions or problems the morning of surgery [phone]  Do not eat food after Midnight the night prior to your surgery/procedure.  After Midnight you may have the following liquids until    07:00 AM DAY OF SURGERY  Clear Liquid Diet Water  Black Coffee (sugar ok, NO MILK/CREAM OR CREAMERS)  Tea (sugar ok, NO MILK/CREAM OR CREAMERS) regular and decaf                             Plain Jell-O  with no fruit (NO RED)                                           Fruit  ices (not with fruit pulp, NO RED)                                     Popsicles (NO RED)                                                                  Juice: NO CITRUS JUICES: only apple, WHITE grape, WHITE cranberry Sports drinks like Gatorade or Powerade (NO RED)                   The day of surgery:  Drink ONE (1) Pre-Surgery Clear Ensure at   07:00  AM the morning of surgery. Drink in one sitting. Do not sip.  This drink was given to you during your hospital pre-op appointment visit. Nothing else  to drink after completing the Pre-Surgery Clear Ensure : No candy, chewing gum or throat lozenges.    FOLLOW ANY ADDITIONAL PRE OP INSTRUCTIONS YOU RECEIVED FROM YOUR SURGEON'S OFFICE!!!   Oral Hygiene is also important to reduce your risk of infection.        Remember - BRUSH YOUR TEETH THE MORNING OF SURGERY WITH YOUR REGULAR TOOTHPASTE  Do NOT smoke after Midnight the night before surgery.  STOP TAKING all Vitamins, Herbs and supplements 1 week before your surgery.   Take ONLY these medicines the morning of surgery with A SIP OF WATER :  You may take EITHER Tylenol  OR Tramadol  depending on the level of your pain.                   You may not have any metal on your body including hair pins, jewelry, and body piercing  Do not wear make-up, lotions, powders, perfumes or deodorant  Do not wear nail polish including gel and S&S, artificial / acrylic nails, or any other type of covering on natural nails including finger and toenails. If you have artificial nails, gel coating, etc., that needs to be removed by a nail salon, Please have this removed prior to surgery. Not doing so may mean that your surgery could be cancelled or delayed if the Surgeon or anesthesia staff feels like they are unable to monitor you safely.   Do not shave 48 hours prior to surgery to avoid nicks in your skin which may contribute to postoperative infections.   Contacts, Hearing Aids, dentures or bridgework may  not be worn into surgery. DENTURES WILL BE REMOVED PRIOR TO SURGERY PLEASE DO NOT APPLY Poly grip OR ADHESIVES!!!  You may bring a small overnight bag with you on the day of surgery, only pack items that are not valuable. [place] [facility] IS NOT RESPONSIBLE   FOR VALUABLES THAT ARE LOST OR STOLEN.   Do not bring your home medications to the hospital. The Pharmacy will dispense medications listed on your medication list to you during your admission in the Hospital.  Please read over the following fact sheets you were given: IF YOU HAVE QUESTIONS ABOUT YOUR PRE-OP INSTRUCTIONS, PLEASE CALL [phone].     Pre-operative 5 CHG Bath Instructions   You can play a key role in reducing the risk of infection after surgery. Your skin needs to be as free of germs as possible. You can reduce the number of germs on your skin by washing with CHG (chlorhexidine  gluconate) soap before surgery. CHG is an antiseptic soap that kills germs and continues to kill germs even after washing.   DO NOT use if you have an allergy to chlorhexidine /CHG or antibacterial soaps. If your skin becomes reddened or irritated, stop using the CHG and notify one of our RNs at [phone]  Please shower with the CHG soap starting 4 days before surgery using the following schedule: START SHOWERS ON   SUNDAY  September 15, 2023  Please keep in mind the following:  DO NOT shave, including legs and underarms, starting the day of your first shower.   You may shave your face at any point before/day of surgery.   Place clean sheets on your bed the day you start using CHG soap. Use a clean washcloth (not used since being washed) for each shower. DO NOT sleep with pets once you start using the CHG.   CHG Shower Instructions:  If you choose to wash your hair and private  area, wash first with your normal shampoo/soap.  After you use shampoo/soap, rinse your hair and body thoroughly to remove shampoo/soap residue.  Turn the water  OFF and apply about 3 tablespoons (45 ml) of CHG soap to a CLEAN washcloth.  Apply CHG soap ONLY FROM YOUR NECK DOWN TO YOUR TOES (washing for 3-5 minutes)  DO NOT use CHG soap on face, private areas, open wounds, or sores.  Pay special attention to the area where your surgery is being performed.  If you are having back surgery, having someone wash your back for you may be helpful.  Wait 2 minutes after CHG soap is applied, then you may rinse off the CHG soap.  Pat dry with a clean towel  Put on clean clothes/pajamas   If you choose to wear lotion, please use ONLY the CHG-compatible lotions on the back of this paper.     Additional instructions for the day of surgery: DO NOT APPLY any lotions, deodorants, cologne, or perfumes.   Put on clean/comfortable clothes.  Brush your teeth.  Ask your nurse before applying any prescription medications to the skin.      CHG Compatible Lotions   Aveeno Moisturizing lotion  Cetaphil Moisturizing Cream  Cetaphil Moisturizing Lotion  Clairol Herbal Essence Moisturizing Lotion, Dry Skin  Clairol Herbal Essence Moisturizing Lotion, Extra Dry Skin  Clairol Herbal Essence Moisturizing Lotion, Normal Skin  Curel Age Defying Therapeutic Moisturizing Lotion with Alpha Hydroxy  Curel Extreme Care Body Lotion  Curel Soothing Hands Moisturizing Hand Lotion  Curel Therapeutic Moisturizing Cream, Fragrance-Free  Curel Therapeutic Moisturizing Lotion, Fragrance-Free  Curel Therapeutic Moisturizing Lotion, Original Formula  Eucerin Daily Replenishing Lotion  Eucerin Dry Skin Therapy Plus Alpha Hydroxy Crme  Eucerin Dry Skin Therapy Plus Alpha Hydroxy Lotion  Eucerin Original Crme  Eucerin Original Lotion  Eucerin Plus Crme Eucerin Plus Lotion  Eucerin TriLipid Replenishing Lotion  Keri  Anti-Bacterial Hand Lotion  Keri Deep Conditioning Original Lotion Dry Skin Formula Softly Scented  Keri Deep Conditioning Original Lotion, Fragrance Free Sensitive Skin Formula  Keri Lotion Fast Absorbing Fragrance Free Sensitive Skin Formula  Keri Lotion Fast Absorbing Softly Scented Dry Skin Formula  Keri Original Lotion  Keri Skin Renewal Lotion Keri Silky Smooth Lotion  Keri Silky Smooth Sensitive Skin Lotion  Nivea Body Creamy Conditioning Oil  Nivea Body Extra Enriched Lotion  Nivea Body Original Lotion  Nivea Body Sheer Moisturizing Lotion Nivea Crme  Nivea Skin Firming Lotion  NutraDerm 30 Skin Lotion  NutraDerm Skin Lotion  NutraDerm Therapeutic Skin Cream  NutraDerm Therapeutic Skin Lotion  ProShield Protective Hand Cream  Provon moisturizing lotion   FAILURE TO FOLLOW THESE INSTRUCTIONS MAY RESULT IN THE CANCELLATION OF YOUR SURGERY  PATIENT SIGNATURE_________________________________  NURSE SIGNATURE__________________________________  ________________________________________________________________________        Nichole Brewer    An incentive spirometer is a tool that can help keep your lungs clear and active. This tool measures how well you are filling your lungs with each breath.  Taking long deep breaths may help reverse or decrease the chance of developing breathing (pulmonary) problems (especially infection) following: A long period of time when you are unable to move or be active. BEFORE THE PROCEDURE  If the spirometer includes an indicator to show your best effort, your nurse or respiratory therapist will set it to a desired goal. If possible, sit up straight or lean slightly forward. Try not to slouch. Hold the incentive spirometer in an upright position. INSTRUCTIONS FOR USE  Sit on the edge of your bed if possible, or sit up as far as you can in bed or on a chair. Hold the incentive spirometer in an upright position. Breathe out  normally. Place the mouthpiece in your mouth and seal your lips tightly around it. Breathe in slowly and as deeply as possible, raising the piston or the ball toward the top of the column. Hold your breath for 3-5 seconds or for as long as possible. Allow the piston or ball to fall to the bottom of the column. Remove the mouthpiece from your mouth and breathe out normally. Rest for a few seconds and repeat Steps 1 through 7 at least 10 times every 1-2 hours when you are awake. Take your time and take a few normal breaths between deep breaths. The spirometer may include an indicator to show your best effort. Use the indicator as a goal to work toward during each repetition. After each set of 10 deep breaths, practice coughing to be sure your lungs are clear. If you have an incision (the cut made at the time of surgery), support your incision when coughing by placing a pillow or rolled up towels firmly against it. Once you are able to get out of bed, walk around indoors and cough well. You may stop using the incentive spirometer when instructed by your caregiver.  RISKS AND COMPLICATIONS Take your time so you do not get dizzy or light-headed. If you are in pain, you may need to take or ask for pain medication before doing incentive spirometry. It is harder to take a deep breath if you are having pain. AFTER USE Rest and breathe slowly and easily. It can be helpful to keep track of a log of your progress. Your caregiver can provide you with a simple table to help with this. If you are using the spirometer at home, follow these instructions: SEEK MEDICAL CARE IF:  You are having difficultly using the spirometer. You have trouble using the spirometer as often as instructed. Your pain medication is not giving enough relief while using the spirometer. You develop fever of 100.5 F (38.1 C) or higher.                                                                                                     SEEK IMMEDIATE MEDICAL CARE IF:  You cough up bloody sputum that had not been present before. You develop fever of 102 F (38.9 C) or greater. You develop worsening pain at or near the incision site. MAKE SURE YOU:  Understand these instructions. Will watch your  condition. Will get help right away if you are not doing well or get worse. Document Released: 05/14/2006 Document Revised: 03/26/2011 Document Reviewed: 07/15/2006 [facility] Patient Information 2014 [place], [place].       WHAT IS A BLOOD TRANSFUSION? Blood Transfusion Information  A transfusion is the replacement of blood or some of its parts. Blood is made up of multiple cells which provide different functions. Red blood cells carry oxygen and are used for blood loss replacement. White blood cells fight against infection. Platelets control bleeding. Plasma helps clot blood. Other blood products are available for specialized needs, such as hemophilia or other clotting disorders. BEFORE THE TRANSFUSION  Who gives blood for transfusions?  Healthy volunteers who are fully evaluated to make sure their blood is safe. This is blood bank blood. Transfusion therapy is the safest it has ever been in the practice of medicine. Before blood is taken from a donor, a complete history is taken to make sure that person has no history of diseases nor engages in risky social behavior (examples are intravenous drug use or sexual activity with multiple partners). The donor's travel history is screened to minimize risk of transmitting infections, such as malaria. The donated blood is tested for signs of infectious diseases, such as HIV and hepatitis. The blood is then tested to be sure it is compatible with you in order to minimize the chance of a transfusion reaction. If you or a relative donates blood, this is often done in anticipation of surgery and is not appropriate for emergency situations. It takes many days to process the donated  blood. RISKS AND COMPLICATIONS Although transfusion therapy is very safe and saves many lives, the main dangers of transfusion include:  Getting an infectious disease. Developing a transfusion reaction. This is an allergic reaction to something in the blood you were given. Every precaution is taken to prevent this. The decision to have a blood transfusion has been considered carefully by your caregiver before blood is given. Blood is not given unless the benefits outweigh the risks. AFTER THE TRANSFUSION Right after receiving a blood transfusion, you will usually feel much better and more energetic. This is especially true if your red blood cells have gotten low (anemic). The transfusion raises the level of the red blood cells which carry oxygen, and this usually causes an energy increase. The nurse administering the transfusion will monitor you carefully for complications. HOME CARE INSTRUCTIONS  No special instructions are needed after a transfusion. You may find your energy is better. Speak with your caregiver about any limitations on activity for underlying diseases you may have. SEEK MEDICAL CARE IF:  Your condition is not improving after your transfusion. You develop redness or irritation at the intravenous (IV) site. SEEK IMMEDIATE MEDICAL CARE IF:  Any of the following symptoms occur over the next 12 hours: Shaking chills. You have a temperature by mouth above 102 F (38.9 C), not controlled by medicine. Chest, back, or muscle pain. People around you feel you are not acting correctly or are confused. Shortness of breath or difficulty breathing. Dizziness and fainting. You get a rash or develop hives. You have a decrease in urine output. Your urine turns a dark color or changes to pink, red, or brown. Any of the following symptoms occur over the next 10 days: You have a temperature by mouth above 102 F (38.9 C), not controlled by medicine. Shortness of breath. Weakness after  normal activity. The white part of the eye turns yellow (jaundice). You  have a decrease in the amount of urine or are urinating less often. Your urine turns a dark color or changes to pink, red, or brown. Document Released: 12/30/1999 Document Revised: 03/26/2011 Document Reviewed: 08/18/2007 [facility] Patient Information 2014 [place], [place].  _______________________________________________________________________       If you would like to see a video about joint replacement:   [email or web address]

## 2023-09-09 ENCOUNTER — Encounter (HOSPITAL_COMMUNITY)
Admission: RE | Admit: 2023-09-09 | Discharge: 2023-09-09 | Disposition: A | Discharge status: Home / Self Care | Source: Ambulatory Visit | Attending: Orthopedic Surgery | Admitting: Orthopedic Surgery

## 2023-09-09 ENCOUNTER — Encounter (HOSPITAL_COMMUNITY): Payer: Self-pay

## 2023-09-09 ENCOUNTER — Other Ambulatory Visit: Payer: Self-pay

## 2023-09-09 VITALS — BP 148/92 | HR 86 | Temp 98.2°F | Resp 20 | Ht 63.0 in | Wt 185.0 lb

## 2023-09-09 DIAGNOSIS — M1612 Unilateral primary osteoarthritis, left hip: Secondary | ICD-10-CM | POA: Diagnosis not present

## 2023-09-09 DIAGNOSIS — Z01818 Encounter for other preprocedural examination: Secondary | ICD-10-CM | POA: Diagnosis present

## 2023-09-09 DIAGNOSIS — I10 Essential (primary) hypertension: Secondary | ICD-10-CM | POA: Insufficient documentation

## 2023-09-09 HISTORY — DX: Cardiac murmur, unspecified: R01.1

## 2023-09-09 HISTORY — DX: Gastro-esophageal reflux disease without esophagitis: K21.9

## 2023-09-09 LAB — BASIC METABOLIC PANEL WITH GFR
Anion gap: 9 (ref 5–15)
BUN: 22 mg/dL (ref 8–23)
CO2: 23 mmol/L (ref 22–32)
Calcium: 9.1 mg/dL (ref 8.9–10.3)
Chloride: 104 mmol/L (ref 98–111)
Creatinine, Ser: 0.71 mg/dL (ref 0.44–1.00)
GFR, Estimated: >60 mL/min (ref >60)
Glucose, Bld: 91 mg/dL (ref 70–99)
Potassium: 3.9 mmol/L (ref 3.5–5.1)
Sodium: 136 mmol/L (ref 135–145)

## 2023-09-09 LAB — CBC
HCT: 39.6 % (ref 36.0–46.0)
Hemoglobin: 12.2 g/dL (ref 12.0–15.0)
MCH: 29.8 pg (ref 26.0–34.0)
MCHC: 30.8 g/dL (ref 30.0–36.0)
MCV: 96.6 fL (ref 80.0–100.0)
Platelets: 251 K/uL (ref 150–400)
RBC: 4.1 MIL/uL (ref 3.87–5.11)
RDW: 12.8 % (ref 11.5–15.5)
WBC: 7.3 K/uL (ref 4.0–10.5)
nRBC: 0 % (ref 0.0–0.2)

## 2023-09-09 LAB — SURGICAL PCR SCREEN
MRSA, PCR: NEGATIVE
Staphylococcus aureus: NEGATIVE

## 2023-09-18 NOTE — Anesthesia Preprocedure Evaluation (Signed)
 Anesthesia Evaluation  Patient identified by MRN, date of birth, ID band Patient awake    Reviewed: Allergy & Precautions, NPO status , Patient's Chart, lab work & pertinent test results  History of Anesthesia Complications (+) PONV and history of anesthetic complications  Airway Mallampati: II  TM Distance: >3 FB Neck ROM: Full    Dental no notable dental hx.    Pulmonary neg pulmonary ROS   Pulmonary exam normal        Cardiovascular hypertension, Normal cardiovascular exam  Normal TTE 2020   Neuro/Psych negative neurological ROS     GI/Hepatic Neg liver ROS,GERD  Controlled,,  Endo/Other  negative endocrine ROS    Renal/GU negative Renal ROS     Musculoskeletal  (+) Arthritis ,    Abdominal   Peds  Hematology negative hematology ROS (+)   Anesthesia Other Findings Day of surgery medications reviewed with patient.  Reproductive/Obstetrics                              Anesthesia Physical Anesthesia Plan  ASA: 2  Anesthesia Plan: Spinal   Post-op Pain Management: Tylenol  PO (pre-op)*   Induction:   PONV Risk Score and Plan: 4 or greater and Treatment may vary due to age or medical condition, Ondansetron , Propofol  infusion and Dexamethasone   Airway Management Planned: Natural Airway and Simple Face Mask  Additional Equipment: None  Intra-op Plan:   Post-operative Plan:   Informed Consent: I have reviewed the patients History and Physical, chart, labs and discussed the procedure including the risks, benefits and alternatives for the proposed anesthesia with the patient or authorized representative who has indicated his/her understanding and acceptance.       Plan Discussed with: CRNA  Anesthesia Plan Comments:          Anesthesia Quick Evaluation

## 2023-09-19 ENCOUNTER — Ambulatory Visit (HOSPITAL_BASED_OUTPATIENT_CLINIC_OR_DEPARTMENT_OTHER): Payer: Self-pay | Admitting: Anesthesiology

## 2023-09-19 ENCOUNTER — Encounter (HOSPITAL_COMMUNITY): Payer: Self-pay | Admitting: Orthopedic Surgery

## 2023-09-19 ENCOUNTER — Observation Stay (HOSPITAL_COMMUNITY)
Admission: RE | Admit: 2023-09-19 | Discharge: 2023-09-20 | Disposition: A | Discharge status: Home / Self Care | Attending: Orthopedic Surgery | Admitting: Orthopedic Surgery

## 2023-09-19 ENCOUNTER — Ambulatory Visit (HOSPITAL_COMMUNITY)

## 2023-09-19 ENCOUNTER — Ambulatory Visit (HOSPITAL_COMMUNITY): Payer: Self-pay | Admitting: Medical

## 2023-09-19 ENCOUNTER — Other Ambulatory Visit: Payer: Self-pay

## 2023-09-19 ENCOUNTER — Encounter (HOSPITAL_COMMUNITY)
Admission: RE | Disposition: A | Discharge status: Home / Self Care | Payer: Self-pay | Source: Home / Self Care | Attending: Orthopedic Surgery

## 2023-09-19 DIAGNOSIS — I10 Essential (primary) hypertension: Secondary | ICD-10-CM | POA: Diagnosis not present

## 2023-09-19 DIAGNOSIS — Z7982 Long term (current) use of aspirin: Secondary | ICD-10-CM | POA: Diagnosis not present

## 2023-09-19 DIAGNOSIS — M25552 Pain in left hip: Secondary | ICD-10-CM | POA: Diagnosis present

## 2023-09-19 DIAGNOSIS — Z96642 Presence of left artificial hip joint: Secondary | ICD-10-CM

## 2023-09-19 DIAGNOSIS — M1612 Unilateral primary osteoarthritis, left hip: Secondary | ICD-10-CM | POA: Diagnosis not present

## 2023-09-19 DIAGNOSIS — Z79899 Other long term (current) drug therapy: Secondary | ICD-10-CM | POA: Insufficient documentation

## 2023-09-19 HISTORY — PX: TOTAL HIP ARTHROPLASTY: SHX124

## 2023-09-19 LAB — TYPE AND SCREEN
ABO/RH(D): O POS
Antibody Screen: NEGATIVE

## 2023-09-19 SURGERY — ARTHROPLASTY, HIP, TOTAL, ANTERIOR APPROACH
Anesthesia: General | Site: Hip | Laterality: Left

## 2023-09-19 MED ORDER — CEFAZOLIN SODIUM-DEXTROSE 2-4 GM/100ML-% IV SOLN
2.0000 g | INTRAVENOUS | Status: AC
  Administered 2023-09-19: 2 g via INTRAVENOUS
  Filled 2023-09-19: qty 100

## 2023-09-19 MED ORDER — METOCLOPRAMIDE HCL 5 MG/ML IJ SOLN: 5.0000 mg | Freq: Three times a day (TID) | INTRAMUSCULAR | Status: DC | PRN

## 2023-09-19 MED ORDER — OXYCODONE HCL 5 MG PO TABS
ORAL_TABLET | ORAL | Status: AC
  Filled 2023-09-19: qty 1

## 2023-09-19 MED ORDER — OXYCODONE HCL 5 MG PO TABS
5.0000 mg | ORAL_TABLET | ORAL | Status: DC | PRN
  Administered 2023-09-19 – 2023-09-20 (×4): 10 mg via ORAL
  Filled 2023-09-19 (×4): qty 2

## 2023-09-19 MED ORDER — FENTANYL CITRATE PF 50 MCG/ML IJ SOSY
PREFILLED_SYRINGE | INTRAMUSCULAR | Status: AC
  Filled 2023-09-19: qty 2

## 2023-09-19 MED ORDER — HYDROMORPHONE HCL 2 MG/ML IJ SOLN
INTRAMUSCULAR | Status: AC
  Filled 2023-09-19: qty 1

## 2023-09-19 MED ORDER — PROPOFOL 1000 MG/100ML IV EMUL
INTRAVENOUS | Status: AC
  Filled 2023-09-19: qty 100

## 2023-09-19 MED ORDER — PROPOFOL 10 MG/ML IV BOLUS
INTRAVENOUS | Status: DC | PRN
  Administered 2023-09-19: 150 mg via INTRAVENOUS
  Administered 2023-09-19: 20 mg via INTRAVENOUS

## 2023-09-19 MED ORDER — SODIUM CHLORIDE (PF) 0.9 % IJ SOLN
INTRAMUSCULAR | Status: AC
  Filled 2023-09-19: qty 10

## 2023-09-19 MED ORDER — 0.9 % SODIUM CHLORIDE (POUR BTL) OPTIME
TOPICAL | Status: DC | PRN
  Administered 2023-09-19: 1000 mL

## 2023-09-19 MED ORDER — DIPHENHYDRAMINE HCL 12.5 MG/5ML PO ELIX: 12.5000 mg | ORAL_SOLUTION | ORAL | Status: DC | PRN

## 2023-09-19 MED ORDER — ONDANSETRON HCL 4 MG/2ML IJ SOLN
INTRAMUSCULAR | Status: AC
  Filled 2023-09-19: qty 2

## 2023-09-19 MED ORDER — CHLORHEXIDINE GLUCONATE 0.12 % MT SOLN
15.0000 mL | Freq: Once | OROMUCOSAL | Status: AC
  Administered 2023-09-19: 15 mL via OROMUCOSAL

## 2023-09-19 MED ORDER — MIDAZOLAM HCL 2 MG/2ML IJ SOLN
INTRAMUSCULAR | Status: DC | PRN
  Administered 2023-09-19: 2 mg via INTRAVENOUS

## 2023-09-19 MED ORDER — ORAL CARE MOUTH RINSE: 15.0000 mL | Freq: Once | OROMUCOSAL | Status: AC

## 2023-09-19 MED ORDER — CEFAZOLIN SODIUM-DEXTROSE 2-4 GM/100ML-% IV SOLN
INTRAVENOUS | Status: AC
  Filled 2023-09-19: qty 100

## 2023-09-19 MED ORDER — ASPIRIN 81 MG PO CHEW
81.0000 mg | CHEWABLE_TABLET | Freq: Two times a day (BID) | ORAL | Status: DC
  Administered 2023-09-19 – 2023-09-20 (×2): 81 mg via ORAL
  Filled 2023-09-19 (×2): qty 1

## 2023-09-19 MED ORDER — ORAL CARE MOUTH RINSE: 15.0000 mL | OROMUCOSAL | Status: DC | PRN

## 2023-09-19 MED ORDER — OXYCODONE HCL 5 MG PO TABS
10.0000 mg | ORAL_TABLET | ORAL | Status: DC | PRN
  Administered 2023-09-19: 10 mg via ORAL
  Filled 2023-09-19: qty 2

## 2023-09-19 MED ORDER — HYDROMORPHONE HCL 1 MG/ML IJ SOLN
0.5000 mg | INTRAMUSCULAR | Status: DC | PRN
  Administered 2023-09-19: 1 mg via INTRAVENOUS
  Filled 2023-09-19: qty 1

## 2023-09-19 MED ORDER — DROPERIDOL 2.5 MG/ML IJ SOLN
0.6250 mg | Freq: Once | INTRAMUSCULAR | Status: AC | PRN
  Administered 2023-09-19: 0.625 mg via INTRAVENOUS

## 2023-09-19 MED ORDER — PHENYLEPHRINE HCL-NACL 20-0.9 MG/250ML-% IV SOLN
INTRAVENOUS | Status: DC | PRN
  Administered 2023-09-19: 50 ug/min via INTRAVENOUS

## 2023-09-19 MED ORDER — SENNA 8.6 MG PO TABS
2.0000 | ORAL_TABLET | Freq: Every day | ORAL | Status: DC
  Administered 2023-09-19: 17.2 mg via ORAL
  Filled 2023-09-19: qty 2

## 2023-09-19 MED ORDER — ACETAMINOPHEN 500 MG PO TABS
1000.0000 mg | ORAL_TABLET | Freq: Once | ORAL | Status: AC
  Administered 2023-09-19: 1000 mg via ORAL
  Filled 2023-09-19: qty 2

## 2023-09-19 MED ORDER — DEXAMETHASONE SODIUM PHOSPHATE 10 MG/ML IJ SOLN
10.0000 mg | Freq: Once | INTRAMUSCULAR | Status: AC
  Administered 2023-09-20: 10 mg via INTRAVENOUS
  Filled 2023-09-19: qty 1

## 2023-09-19 MED ORDER — BUPIVACAINE-EPINEPHRINE (PF) 0.25% -1:200000 IJ SOLN
INTRAMUSCULAR | Status: AC
  Filled 2023-09-19: qty 30

## 2023-09-19 MED ORDER — ONDANSETRON HCL 4 MG/2ML IJ SOLN
INTRAMUSCULAR | Status: DC | PRN
  Administered 2023-09-19: 4 mg via INTRAVENOUS

## 2023-09-19 MED ORDER — DEXAMETHASONE SODIUM PHOSPHATE 10 MG/ML IJ SOLN
INTRAMUSCULAR | Status: AC
  Filled 2023-09-19: qty 1

## 2023-09-19 MED ORDER — METOCLOPRAMIDE HCL 5 MG PO TABS: 5.0000 mg | ORAL_TABLET | Freq: Three times a day (TID) | ORAL | Status: DC | PRN

## 2023-09-19 MED ORDER — FENTANYL CITRATE PF 50 MCG/ML IJ SOSY
25.0000 ug | PREFILLED_SYRINGE | INTRAMUSCULAR | Status: DC | PRN
  Administered 2023-09-19 (×2): 50 ug via INTRAVENOUS

## 2023-09-19 MED ORDER — BISACODYL 10 MG RE SUPP: 10.0000 mg | Freq: Every day | RECTAL | Status: DC | PRN

## 2023-09-19 MED ORDER — ONDANSETRON HCL 4 MG/2ML IJ SOLN: 4.0000 mg | Freq: Four times a day (QID) | INTRAMUSCULAR | Status: DC | PRN

## 2023-09-19 MED ORDER — CEFAZOLIN SODIUM-DEXTROSE 2-4 GM/100ML-% IV SOLN
2.0000 g | Freq: Four times a day (QID) | INTRAVENOUS | Status: AC
  Administered 2023-09-19 (×2): 2 g via INTRAVENOUS
  Filled 2023-09-19: qty 100

## 2023-09-19 MED ORDER — DROPERIDOL 2.5 MG/ML IJ SOLN
INTRAMUSCULAR | Status: AC
  Filled 2023-09-19: qty 2

## 2023-09-19 MED ORDER — PROPOFOL 10 MG/ML IV BOLUS
INTRAVENOUS | Status: AC
  Filled 2023-09-19: qty 20

## 2023-09-19 MED ORDER — SODIUM CHLORIDE 0.9 % IV SOLN: INTRAVENOUS | Status: DC

## 2023-09-19 MED ORDER — LIDOCAINE HCL (CARDIAC) PF 100 MG/5ML IV SOSY
PREFILLED_SYRINGE | INTRAVENOUS | Status: DC | PRN
  Administered 2023-09-19: 100 mg via INTRAVENOUS

## 2023-09-19 MED ORDER — ALUM & MAG HYDROXIDE-SIMETH 200-200-20 MG/5ML PO SUSP: 30.0000 mL | ORAL | Status: DC | PRN

## 2023-09-19 MED ORDER — DEXAMETHASONE SODIUM PHOSPHATE 10 MG/ML IJ SOLN
INTRAMUSCULAR | Status: DC | PRN
  Administered 2023-09-19: 10 mg via INTRAVENOUS

## 2023-09-19 MED ORDER — POVIDONE-IODINE 10 % EX SWAB: 2.0000 | Freq: Once | CUTANEOUS | Status: DC

## 2023-09-19 MED ORDER — DEXAMETHASONE SODIUM PHOSPHATE 10 MG/ML IJ SOLN: 8.0000 mg | Freq: Once | INTRAMUSCULAR | Status: DC

## 2023-09-19 MED ORDER — PROPOFOL 500 MG/50ML IV EMUL
INTRAVENOUS | Status: DC | PRN
Start: 2023-09-19 — End: 2023-09-19
  Administered 2023-09-19: 130 ug/kg/min via INTRAVENOUS
  Administered 2023-09-19: 50 ug/kg/min via INTRAVENOUS

## 2023-09-19 MED ORDER — PROPOFOL 500 MG/50ML IV EMUL
INTRAVENOUS | Status: AC
  Filled 2023-09-19: qty 50

## 2023-09-19 MED ORDER — SODIUM CHLORIDE (PF) 0.9 % IJ SOLN
INTRAMUSCULAR | Status: AC
  Filled 2023-09-19: qty 30

## 2023-09-19 MED ORDER — FENTANYL CITRATE PF 50 MCG/ML IJ SOSY
PREFILLED_SYRINGE | INTRAMUSCULAR | Status: AC
  Filled 2023-09-19: qty 1

## 2023-09-19 MED ORDER — STERILE WATER FOR IRRIGATION IR SOLN
Status: DC | PRN
  Administered 2023-09-19: 1000 mL

## 2023-09-19 MED ORDER — HYDROMORPHONE HCL 1 MG/ML IJ SOLN
INTRAMUSCULAR | Status: DC | PRN
  Administered 2023-09-19 (×5): .4 mg via INTRAVENOUS

## 2023-09-19 MED ORDER — ACETAMINOPHEN 500 MG PO TABS
ORAL_TABLET | ORAL | Status: AC
  Filled 2023-09-19: qty 2

## 2023-09-19 MED ORDER — OXYCODONE HCL 5 MG/5ML PO SOLN: 5.0000 mg | Freq: Once | ORAL | Status: AC | PRN

## 2023-09-19 MED ORDER — LACTATED RINGERS IV SOLN: INTRAVENOUS | Status: DC

## 2023-09-19 MED ORDER — METHOCARBAMOL 1000 MG/10ML IJ SOLN: 500.0000 mg | Freq: Four times a day (QID) | INTRAMUSCULAR | Status: DC | PRN

## 2023-09-19 MED ORDER — TRANEXAMIC ACID-NACL 1000-0.7 MG/100ML-% IV SOLN
1000.0000 mg | INTRAVENOUS | Status: AC
  Administered 2023-09-19: 1000 mg via INTRAVENOUS
  Filled 2023-09-19: qty 100

## 2023-09-19 MED ORDER — OXYCODONE HCL 5 MG PO TABS
5.0000 mg | ORAL_TABLET | Freq: Once | ORAL | Status: AC | PRN
  Administered 2023-09-19: 5 mg via ORAL

## 2023-09-19 MED ORDER — METHOCARBAMOL 500 MG PO TABS
ORAL_TABLET | ORAL | Status: AC
  Filled 2023-09-19: qty 1

## 2023-09-19 MED ORDER — BUPIVACAINE IN DEXTROSE 0.75-8.25 % IT SOLN
INTRATHECAL | Status: DC | PRN
  Administered 2023-09-19: 1.6 mL via INTRATHECAL

## 2023-09-19 MED ORDER — FENTANYL CITRATE (PF) 100 MCG/2ML IJ SOLN
INTRAMUSCULAR | Status: DC | PRN
  Administered 2023-09-19: 100 ug via INTRAVENOUS

## 2023-09-19 MED ORDER — ONDANSETRON HCL 4 MG PO TABS: 4.0000 mg | ORAL_TABLET | Freq: Four times a day (QID) | ORAL | Status: DC | PRN

## 2023-09-19 MED ORDER — METHOCARBAMOL 500 MG PO TABS
500.0000 mg | ORAL_TABLET | Freq: Four times a day (QID) | ORAL | Status: DC | PRN
  Administered 2023-09-19 – 2023-09-20 (×4): 500 mg via ORAL
  Filled 2023-09-19 (×3): qty 1

## 2023-09-19 MED ORDER — TRANEXAMIC ACID-NACL 1000-0.7 MG/100ML-% IV SOLN
INTRAVENOUS | Status: AC
  Filled 2023-09-19: qty 100

## 2023-09-19 MED ORDER — LACTATED RINGERS IV BOLUS
500.0000 mL | Freq: Once | INTRAVENOUS | Status: AC
  Administered 2023-09-19: 500 mL via INTRAVENOUS

## 2023-09-19 MED ORDER — MIDAZOLAM HCL 2 MG/2ML IJ SOLN
INTRAMUSCULAR | Status: AC
  Filled 2023-09-19: qty 2

## 2023-09-19 MED ORDER — ROSUVASTATIN CALCIUM 10 MG PO TABS
10.0000 mg | ORAL_TABLET | ORAL | Status: DC
  Administered 2023-09-20: 10 mg via ORAL
  Filled 2023-09-19: qty 1

## 2023-09-19 MED ORDER — ACETAMINOPHEN 500 MG PO TABS
1000.0000 mg | ORAL_TABLET | Freq: Four times a day (QID) | ORAL | Status: DC
  Administered 2023-09-19 – 2023-09-20 (×4): 1000 mg via ORAL
  Filled 2023-09-19 (×3): qty 2

## 2023-09-19 MED ORDER — MENTHOL 3 MG MT LOZG: 1.0000 | LOZENGE | OROMUCOSAL | Status: DC | PRN

## 2023-09-19 MED ORDER — SODIUM CHLORIDE (PF) 0.9 % IJ SOLN
INTRAMUSCULAR | Status: DC | PRN
  Administered 2023-09-19: 61 mL

## 2023-09-19 MED ORDER — CELECOXIB 200 MG PO CAPS
200.0000 mg | ORAL_CAPSULE | Freq: Two times a day (BID) | ORAL | Status: DC
  Administered 2023-09-19 – 2023-09-20 (×2): 200 mg via ORAL
  Filled 2023-09-19 (×2): qty 1

## 2023-09-19 MED ORDER — FENTANYL CITRATE (PF) 100 MCG/2ML IJ SOLN
INTRAMUSCULAR | Status: AC
  Filled 2023-09-19: qty 2

## 2023-09-19 MED ORDER — POLYETHYLENE GLYCOL 3350 17 G PO PACK
17.0000 g | PACK | Freq: Two times a day (BID) | ORAL | Status: DC
  Administered 2023-09-19 – 2023-09-20 (×2): 17 g via ORAL
  Filled 2023-09-19 (×2): qty 1

## 2023-09-19 MED ORDER — TRANEXAMIC ACID-NACL 1000-0.7 MG/100ML-% IV SOLN
1000.0000 mg | Freq: Once | INTRAVENOUS | Status: AC
  Administered 2023-09-19: 1000 mg via INTRAVENOUS

## 2023-09-19 MED ORDER — KETOROLAC TROMETHAMINE 30 MG/ML IJ SOLN
INTRAMUSCULAR | Status: AC
  Filled 2023-09-19: qty 1

## 2023-09-19 MED ORDER — PHENOL 1.4 % MT LIQD: 1.0000 | OROMUCOSAL | Status: DC | PRN

## 2023-09-19 MED ORDER — LIDOCAINE HCL (PF) 2 % IJ SOLN
INTRAMUSCULAR | Status: AC
  Filled 2023-09-19: qty 5

## 2023-09-19 SURGICAL SUPPLY — 38 items
BAG COUNTER SPONGE SURGICOUNT (BAG) IMPLANT
BAG ZIPLOCK 12X15 (MISCELLANEOUS) ×1 IMPLANT
BLADE SAG 18X100X1.27 (BLADE) ×1 IMPLANT
COVER PERINEAL POST (MISCELLANEOUS) ×1 IMPLANT
COVER SURGICAL LIGHT HANDLE (MISCELLANEOUS) ×1 IMPLANT
CUP ACET PINNACLE SECTR 50MM (Hips) IMPLANT
DERMABOND ADVANCED .7 DNX12 (GAUZE/BANDAGES/DRESSINGS) ×1 IMPLANT
DRAPE FOOT SWITCH (DRAPES) ×1 IMPLANT
DRAPE STERI IOBAN 125X83 (DRAPES) ×1 IMPLANT
DRAPE U-SHAPE 47X51 STRL (DRAPES) ×2 IMPLANT
DRESSING AQUACEL AG SP 3.5X10 (GAUZE/BANDAGES/DRESSINGS) ×1 IMPLANT
DRSG AQUACEL AG ADV 3.5X10 (GAUZE/BANDAGES/DRESSINGS) IMPLANT
DURAPREP 26ML APPLICATOR (WOUND CARE) ×1 IMPLANT
ELECT REM PT RETURN 15FT ADLT (MISCELLANEOUS) ×1 IMPLANT
GLOVE BIO SURGEON STRL SZ 6 (GLOVE) ×1 IMPLANT
GLOVE BIOGEL PI IND STRL 6.5 (GLOVE) ×1 IMPLANT
GLOVE BIOGEL PI IND STRL 7.5 (GLOVE) ×1 IMPLANT
GLOVE ORTHO TXT STRL SZ7.5 (GLOVE) ×2 IMPLANT
GOWN STRL REUS W/ TWL LRG LVL3 (GOWN DISPOSABLE) ×2 IMPLANT
HEAD FEMORAL 32 CERAMIC (Hips) IMPLANT
HOLDER FOLEY CATH W/STRAP (MISCELLANEOUS) ×1 IMPLANT
KIT TURNOVER KIT A (KITS) ×1 IMPLANT
LINER ACET PNNCL PLUS4 NEUTRAL (Hips) IMPLANT
MANIFOLD NEPTUNE II (INSTRUMENTS) ×1 IMPLANT
NDL SAFETY ECLIPSE 18X1.5 (NEEDLE) ×1 IMPLANT
PACK ANTERIOR HIP CUSTOM (KITS) ×1 IMPLANT
PENCIL SMOKE EVACUATOR (MISCELLANEOUS) ×1 IMPLANT
SCREW 6.5MMX30MM (Screw) IMPLANT
STEM FEMORAL SZ5 HIGH ACTIS (Stem) IMPLANT
SUT MNCRL AB 4-0 PS2 18 (SUTURE) ×1 IMPLANT
SUT VIC AB 1 CT1 36 (SUTURE) ×3 IMPLANT
SUT VIC AB 2-0 CT1 TAPERPNT 27 (SUTURE) ×2 IMPLANT
SUTURE STRATFX 0 PDS 27 VIOLET (SUTURE) ×1 IMPLANT
SYR 3ML LL SCALE MARK (SYRINGE) ×1 IMPLANT
TOWEL GREEN STERILE FF (TOWEL DISPOSABLE) ×1 IMPLANT
TRAY FOLEY MTR SLVR 16FR STAT (SET/KITS/TRAYS/PACK) ×1 IMPLANT
TUBE SUCTION HIGH CAP CLEAR NV (SUCTIONS) ×1 IMPLANT
WATER STERILE IRR 1000ML POUR (IV SOLUTION) ×1 IMPLANT

## 2023-09-19 NOTE — Anesthesia Postprocedure Evaluation (Signed)
 Anesthesia Post Note  Patient: Nichole Brewer  Procedure(s) Performed: ARTHROPLASTY, HIP, TOTAL, ANTERIOR APPROACH (Left: Hip)     Patient location during evaluation: PACU Anesthesia Type: General Level of consciousness: awake and alert Pain management: pain level controlled Vital Signs Assessment: post-procedure vital signs reviewed and stable Respiratory status: spontaneous breathing, nonlabored ventilation and respiratory function stable Cardiovascular status: blood pressure returned to baseline Postop Assessment: no apparent nausea or vomiting Anesthetic complications: no   No notable events documented.  Last Vitals:  Vitals:   09/19/23 1100 09/19/23 1115  BP: (!) 168/84 (!) 172/91  Pulse: 86 87  Resp: 12 (!) 21  Temp:    SpO2: 100% 95%    Last Pain:  Vitals:   09/19/23 1137  TempSrc:   PainSc: 4                  [address] Row

## 2023-09-19 NOTE — H&P (Signed)
 TOTAL HIP ADMISSION H&P  Patient is admitted for left total hip arthroplasty.  Therapy Plans: HEP Disposition: Home with daughter (lives together) Planned DVT Prophylaxis: aspirin  81mg  BID DME needed: none PCP: Dr. Sheryle - clearance received TXA: IV Allergies: NKDA Anesthesia Concerns: nausea/vomiting with colonoscopy // scope patch BMI: 32.6 Last HgbA1c: not diabetic   Other: - Hoping for SDD - oxycodone , robaxin  ,tylenol , celebrex  - possible tooth abscess -- seeing dentist next week ,will send her with clearance form  Subjective:  Chief Complaint: left hip pain  HPI: Nichole Brewer, 73 y.o. female, has a history of pain and functional disability in the left hip(s) due to arthritis and patient has failed non-surgical conservative treatments for greater than 12 weeks to include NSAID's and/or analgesics and activity modification.  Onset of symptoms was gradual starting 2 years ago with gradually worsening course since that time.The patient noted no past surgery on the left hip(s).  Patient currently rates pain in the left hip at 8 out of 10 with activity. Patient has worsening of pain with activity and weight bearing, pain that interfers with activities of daily living, and pain with passive range of motion. Patient has evidence of joint space narrowing by imaging studies. This condition presents safety issues increasing the risk of falls. T There is no current active infection.  Patient Active Problem List   Diagnosis Date Noted   Blood in urine 08/20/2019   Decreased estrogen level 08/20/2019   Menopausal syndrome 08/20/2019   Herniation of lumbar intervertebral disc with radiculopathy 12/22/2015   Spinal stenosis of lumbar region with neurogenic claudication 11/30/2015   Post-operative nausea and vomiting    Encounter for screening colonoscopy 06/29/2015   PONV (postoperative nausea and vomiting) 06/29/2015   Past Medical History:  Diagnosis Date   Arthritis    DJD  (degenerative joint disease)    GERD (gastroesophageal reflux disease)    Heart murmur    had ECHO in 2020   Hypertension    Osteoarthritis    PONV (postoperative nausea and vomiting)    after colonoscopy    Past Surgical History:  Procedure Laterality Date   CATARACT EXTRACTION W/PHACO Left 04/06/2013   Procedure: CATARACT EXTRACTION PHACO AND INTRAOCULAR LENS PLACEMENT (IOC);  Surgeon: Cherene Mania, MD;  Location: [facility];  Service: Ophthalmology;  Laterality: Left;  CDE 13.72   CATARACT EXTRACTION W/PHACO Right 04/16/2013   Procedure: CATARACT EXTRACTION PHACO AND INTRAOCULAR LENS PLACEMENT (IOC);  Surgeon: Cherene Mania, MD;  Location: [facility];  Service: Ophthalmology;  Laterality: Right;  CDE:15.88   CERVICAL FUSION  2021   ACDF    ? levels   by Dr. Louis  at the [facility]   CESAREAN SECTION  1986   COLONOSCOPY N/A 07/22/2015   Procedure: COLONOSCOPY;  Surgeon: Lamar CHRISTELLA Hollingshead, MD;  Location: [facility];  Service: Endoscopy;  Laterality: N/A;  0830   DILATION AND CURETTAGE OF UTERUS  1977   JOINT REPLACEMENT Right 2010   hip    Current Facility-Administered Medications  Medication Dose Route Frequency Provider Last Rate Last Admin   ceFAZolin  (ANCEF ) IVPB 2g/100 mL premix  2 g Intravenous On Call to OR Patti Rosina JONELLE, PA-C       dexamethasone  (DECADRON ) injection 8 mg  8 mg Intravenous Once Patti Rosina JONELLE, PA-C       lactated ringers  infusion   Intravenous Continuous Cleotilde Butler Dade, MD 10 mL/hr at 09/19/23 0651 New Bag at 09/19/23 0651   povidone-iodine  10 %  swab 2 Application  2 Application Topical Once Patti Rosina SAUNDERS, PA-C       tranexamic acid  (CYKLOKAPRON ) IVPB 1,000 mg  1,000 mg Intravenous To OR Patti Rosina SAUNDERS, PA-C       No Known Allergies  Social History   Tobacco Use   Smoking status: Never   Smokeless tobacco: Never  Substance Use Topics   Alcohol use: No    Alcohol/week: 0.0 standard drinks of alcohol    Family History  Problem Relation Age of Onset    Hypertension Mother    Arthritis Mother    Alzheimer's disease Father    Lung cancer Father    CAD Brother        stent placement   Colon cancer Neg Hx      Review of Systems  Constitutional:  Negative for chills and fever.  Respiratory:  Negative for cough and shortness of breath.   Cardiovascular:  Negative for chest pain.  Gastrointestinal:  Negative for nausea and vomiting.  Musculoskeletal:  Positive for arthralgias.     Objective:  Physical Exam Musculoskeletal: Left hip exam: Painful and limited left hip flexion internal rotation to 10 degrees with pelvic tilting due to pain, external rotation over 20 degrees No significant external rotation contracture with maintenance of 5/5 strength Neurovascularly intact in both lower extremities  Vital signs in last 24 hours: Temp:  [97.9 F (36.6 C)] 97.9 F (36.6 C) (09/04 0609) Pulse Rate:  [113] 113 (09/04 0609) Resp:  [18] 18 (09/04 0609) BP: (182-203)/(81-92) 182/81 (09/04 0624) SpO2:  [98 %] 98 % (09/04 0609) Weight:  [83.9 kg] 83.9 kg (09/04 0641)  Labs:   Estimated body mass index is 32.77 kg/m as calculated from the following:   Height as of this encounter: 5' 3 (1.6 m).   Weight as of this encounter: 83.9 kg.   Imaging Review Plain radiographs demonstrate severe degenerative joint disease of the left hip(s). The bone quality appears to be adequate for age and reported activity level.      Assessment/Plan:  End stage arthritis, left hip(s)  The patient history, physical examination, clinical judgement of the provider and imaging studies are consistent with end stage degenerative joint disease of the left hip(s) and total hip arthroplasty is deemed medically necessary. The treatment options including medical management, injection therapy, arthroscopy and arthroplasty were discussed at length. The risks and benefits of total hip arthroplasty were presented and reviewed. The risks due to aseptic loosening,  infection, stiffness, dislocation/subluxation,  thromboembolic complications and other imponderables were discussed.  The patient acknowledged the explanation, agreed to proceed with the plan and consent was signed. Patient is being admitted for inpatient treatment for surgery, pain control, PT, OT, prophylactic antibiotics, VTE prophylaxis, progressive ambulation and ADL's and discharge planning.The patient is planning to be discharged home.   Rosina Patti, PA-C Orthopedic Surgery [facility] [place] [phone]

## 2023-09-19 NOTE — Anesthesia Procedure Notes (Signed)
 Spinal  Patient location during procedure: OR Start time: 09/19/2023 8:49 AM End time: 09/19/2023 8:53 AM Reason for block: surgical anesthesia Staffing Performed: anesthesiologist  Anesthesiologist: Paul Lamarr BRAVO, MD Performed by: Paul Lamarr BRAVO, MD Authorized by: Paul Lamarr BRAVO, MD   Preanesthetic Checklist Completed: patient identified, IV checked, risks and benefits discussed, surgical consent, monitors and equipment checked, pre-op evaluation and timeout performed Spinal Block Patient position: sitting Prep: DuraPrep and site prepped and draped Patient monitoring: continuous pulse ox, blood pressure and heart rate Approach: midline Location: L3-4 Injection technique: single-shot Needle Needle type: Pencan  Needle gauge: 24 G Needle length: 9 cm Assessment Events: failed spinal and CSF return Additional Notes Risks, benefits, and alternative discussed. Patient gave consent to procedure. Prepped and draped in sitting position. Patient sedated but responsive to voice. Clear CSF on second attempt (first at L2-3, second at L3-4). Free flow of CSF but unable to aspirate syringe despite respositioning. No pain or paraesthesias with injection. Patient tolerated procedure well. Vital signs stable. LANEY Paul, MD

## 2023-09-19 NOTE — Discharge Instructions (Signed)

## 2023-09-19 NOTE — Plan of Care (Signed)

## 2023-09-19 NOTE — Op Note (Signed)
 NAME:  Nichole Brewer                ACCOUNT NO.: [id number]      MEDICAL RECORD NO.: [id number]      FACILITY:  [facility]      PHYSICIAN:  [name]  DATE OF BIRTH:  1950/04/02     DATE OF PROCEDURE:  09/19/2023                                 OPERATIVE REPORT         PREOPERATIVE DIAGNOSIS: Left  hip osteoarthritis.      POSTOPERATIVE DIAGNOSIS:  Left hip osteoarthritis.      PROCEDURE:  Left total hip replacement through an anterior approach   utilizing DePuy THR system, component size 50 mm pinnacle cup, a size 32+4 neutral   Altrex liner, a size 5 Hi Actis stem with a 32+1 delta ceramic   ball.      SURGEON:  Donnice JONETTA. Car, M.D.      ASSISTANT:  Rosina Calin, PA-C     ANESTHESIA:  Spinal.      SPECIMENS:  None.      COMPLICATIONS:  None.      BLOOD LOSS:  250 cc     DRAINS:  None.      INDICATION OF THE PROCEDURE:  Nichole Brewer is a 73 y.o. female who had   presented to office for evaluation of left hip pain.  Radiographs revealed   progressive degenerative changes with bone-on-bone   articulation of the  hip joint, including subchondral cystic changes and osteophytes.  The patient had painful limited range of   motion significantly affecting their overall quality of life and function.  The patient was failing to    respond to conservative measures including medications and/or injections and activity modification and at this point was ready   to proceed with more definitive measures.  Consent was obtained for   benefit of pain relief.  Specific risks of infection, DVT, component   failure, dislocation, neurovascular injury, and need for revision surgery were reviewed in the office.     PROCEDURE IN DETAIL:  The patient was brought to operative theater.   Once adequate anesthesia, preoperative antibiotics, 2 gm of Ancef , 1 gm of Tranexamic Acid , and 10 mg of Decadron  were administered, the patient was positioned supine on the  [employer] table.  Once the patient was safely positioned with adequate padding of boney prominences we predraped out the hip, and used fluoroscopy to confirm orientation of the pelvis.      The left hip was then prepped and draped from proximal iliac crest to   mid thigh with a shower curtain technique.      Time-out was performed identifying the patient, planned procedure, and the appropriate extremity.     An incision was then made 2 cm lateral to the   anterior superior iliac spine extending over the orientation of the   tensor fascia lata muscle and sharp dissection was carried down to the   fascia of the muscle.      The fascia was then incised.  The muscle belly was identified and swept   laterally and retractor placed along the superior neck.  Following   cauterization of the circumflex vessels and removing some pericapsular   fat, a second cobra retractor was placed on the inferior  neck.  A T-capsulotomy was made along the line of the   superior neck to the trochanteric fossa, then extended proximally and   distally.  Tag sutures were placed and the retractors were then placed   intracapsular.  We then identified the trochanteric fossa and   orientation of my neck cut and then made a neck osteotomy with the femur on traction.  The femoral   head was removed without difficulty or complication.  Traction was let   off and retractors were placed posterior and anterior around the   acetabulum.      The labrum and foveal tissue were debrided.  I began reaming with a 45 mm   reamer and reamed up to 49 mm reamer with good bony bed preparation and a 50 mm  cup was chosen.  The final 50 mm Pinnacle cup was then impacted under fluoroscopy to confirm the depth of penetration and orientation with respect to   Abduction and forward flexion.  A screw was placed into the ilium followed by the hole eliminator.  The final   32+4 neutral Altrex liner was impacted with good visualized rim fit.  The  cup was positioned anatomically within the acetabular portion of the pelvis.      At this point, the femur was rolled to 100 degrees.  Further capsule was   released off the inferior aspect of the femoral neck.  I then   released the superior capsule proximally.  With the leg in a neutral position the hook was placed laterally   along the femur under the vastus lateralis origin and elevated manually and then held in position using the hook attachment on the bed.  The leg was then extended and adducted with the leg rolled to 100   degrees of external rotation.  Retractors were placed along the medial calcar and posteriorly over the greater trochanter.  Once the proximal femur was fully   exposed, I used a box osteotome to set orientation.  I then began   broaching with the starting chili pepper broach and passed this by hand and then broached up to 5.  With the 5 broach in place I chose a high offset neck and did several trial reductions.  The offset was appropriate, leg lengths   appeared to be equal best matched with the +1 head ball trial confirmed radiographically.   Given these findings, I went ahead and dislocated the hip, repositioned all   retractors and positioned the right hip in the extended and abducted position.  The final 5 Hi Actis stem was   chosen and it was impacted down to the level of neck cut.  Based on this   and the trial reductions, a final 32+4 delta ceramic ball was chosen and   impacted onto a clean and dry trunnion, and the hip was reduced.  The   hip had been irrigated throughout the case again at this point.  I did   reapproximate the superior capsular leaflet to the anterior leaflet   using #1 Vicryl.  The fascia of the   tensor fascia lata muscle was then reapproximated using #1 Vicryl and #0 Stratafix sutures.  The   remaining wound was closed with 2-0 Vicryl and running 4-0 Monocryl.   The hip was cleaned, dried, and dressed sterilely using Dermabond and    Aquacel dressing.  The patient was then brought   to recovery room in stable condition tolerating the procedure well.    Rosina  Patti, PA-C was present for the entirety of the case involved from   preoperative positioning, perioperative retractor management, general   facilitation of the case, as well as primary wound closure as assistant.            Donnice CORDOBA Ernie, M.D.        09/19/2023 7:36 AM

## 2023-09-19 NOTE — Transfer of Care (Signed)
 Immediate Anesthesia Transfer of Care Note  Patient: Nichole Brewer  Procedure(s) Performed: ARTHROPLASTY, HIP, TOTAL, ANTERIOR APPROACH (Left: Hip)  Patient Location: PACU  Anesthesia Type:General  Level of Consciousness: awake, alert , oriented, and patient cooperative  Airway & Oxygen Therapy: Patient Spontanous Breathing and Patient connected to face mask oxygen  Post-op Assessment: Report given to RN, Post -op Vital signs reviewed and stable, and Patient moving all extremities  Post vital signs: Reviewed and stable  Last Vitals:  Vitals Value Taken Time  BP 152/98 09/19/23 10:47  Temp    Pulse 93 09/19/23 10:49  Resp 15 09/19/23 10:49  SpO2 100 % 09/19/23 10:49  Vitals shown include unfiled device data.  Last Pain:  Vitals:   09/19/23 0609  TempSrc: Oral         Complications: No notable events documented.

## 2023-09-19 NOTE — Anesthesia Procedure Notes (Signed)
 Procedure Name: LMA Insertion Date/Time: 09/19/2023 9:08 AM  Performed by: Therisa Doyal CROME, CRNAPatient Re-evaluated:Patient Re-evaluated prior to induction Oxygen Delivery Method: Circle system utilized Preoxygenation: Pre-oxygenation with 100% oxygen Induction Type: IV induction LMA: LMA inserted LMA Size: 4.0 Number of attempts: 1 Placement Confirmation: positive ETCO2 and breath sounds checked- equal and bilateral Tube secured with: Tape Dental Injury: Teeth and Oropharynx as per pre-operative assessment

## 2023-09-19 NOTE — Plan of Care (Signed)

## 2023-09-19 NOTE — Interval H&P Note (Signed)
 History and Physical Interval Note:  09/19/2023 7:18 AM  Nichole Brewer  has presented today for surgery, with the diagnosis of Left hip osteoarthritis.  The various methods of treatment have been discussed with the patient and family. After consideration of risks, benefits and other options for treatment, the patient has consented to  Procedure(s): ARTHROPLASTY, HIP, TOTAL, ANTERIOR APPROACH (Left) as a surgical intervention.  The patient's history has been reviewed, patient examined, no change in status, stable for surgery.  I have reviewed the patient's chart and labs.  Questions were answered to the patient's satisfaction.     [name]

## 2023-09-20 ENCOUNTER — Encounter (HOSPITAL_COMMUNITY): Payer: Self-pay | Admitting: Orthopedic Surgery

## 2023-09-20 DIAGNOSIS — M1612 Unilateral primary osteoarthritis, left hip: Secondary | ICD-10-CM | POA: Diagnosis not present

## 2023-09-20 LAB — CBC
HCT: 30.8 % — ABNORMAL LOW (ref 36.0–46.0)
Hemoglobin: 9.9 g/dL — ABNORMAL LOW (ref 12.0–15.0)
MCH: 31.4 pg (ref 26.0–34.0)
MCHC: 32.1 g/dL (ref 30.0–36.0)
MCV: 97.8 fL (ref 80.0–100.0)
Platelets: 180 K/uL (ref 150–400)
RBC: 3.15 MIL/uL — ABNORMAL LOW (ref 3.87–5.11)
RDW: 12.8 % (ref 11.5–15.5)
WBC: 17.3 K/uL — ABNORMAL HIGH (ref 4.0–10.5)
nRBC: 0 % (ref 0.0–0.2)

## 2023-09-20 LAB — BASIC METABOLIC PANEL WITH GFR
Anion gap: 10 (ref 5–15)
BUN: 14 mg/dL (ref 8–23)
CO2: 23 mmol/L (ref 22–32)
Calcium: 8.8 mg/dL — ABNORMAL LOW (ref 8.9–10.3)
Chloride: 104 mmol/L (ref 98–111)
Creatinine, Ser: 0.75 mg/dL (ref 0.44–1.00)
GFR, Estimated: >60 mL/min (ref >60)
Glucose, Bld: 145 mg/dL — ABNORMAL HIGH (ref 70–99)
Potassium: 4.9 mmol/L (ref 3.5–5.1)
Sodium: 137 mmol/L (ref 135–145)

## 2023-09-20 MED ORDER — SENNA 8.6 MG PO TABS: 2.0000 | ORAL_TABLET | Freq: Every day | ORAL | 0 refills | Status: AC

## 2023-09-20 MED ORDER — POLYETHYLENE GLYCOL 3350 17 G PO PACK: 17.0000 g | PACK | Freq: Two times a day (BID) | ORAL | 0 refills | Status: AC

## 2023-09-20 MED ORDER — ASPIRIN 81 MG PO CHEW: 81.0000 mg | CHEWABLE_TABLET | Freq: Two times a day (BID) | ORAL | 0 refills | Status: AC

## 2023-09-20 MED ORDER — METHOCARBAMOL 500 MG PO TABS: 500.0000 mg | ORAL_TABLET | Freq: Four times a day (QID) | ORAL | 2 refills | Status: AC | PRN

## 2023-09-20 MED ORDER — OXYCODONE HCL 5 MG PO TABS: 5.0000 mg | ORAL_TABLET | ORAL | 0 refills | Status: AC | PRN

## 2023-09-20 MED ORDER — CELECOXIB 200 MG PO CAPS: 200.0000 mg | ORAL_CAPSULE | Freq: Two times a day (BID) | ORAL | 0 refills | Status: AC

## 2023-09-20 NOTE — Care Management Obs Status (Signed)
 MEDICARE OBSERVATION STATUS NOTIFICATION   Patient Details  Name: Nichole Brewer MRN: [id number] Date of Birth: 09-Jan-1951   Medicare Observation Status Notification Given:  Yes    [name], RN 09/20/2023, 10:53 AM

## 2023-09-20 NOTE — Progress Notes (Signed)
 Discharge instructions given to patient questions asked and answered. Sylvia Everts RN

## 2023-09-20 NOTE — Progress Notes (Signed)
   Subjective: 1 Day Post-Op Procedure(s) (LRB): ARTHROPLASTY, HIP, TOTAL, ANTERIOR APPROACH (Left) Patient reports pain as mild.   Patient seen in rounds with Dr. Ernie. Patient is resting in bed on exam this morning. No acute events overnight. Foley catheter removed. Patient has not been up with PT yet. She had significant pain in PACU, but reports she is feeling much better now.  We will start therapy today.   Objective: Vital signs in last 24 hours: Temp:  [97 F (36.1 C)-98.9 F (37.2 C)] 97.8 F (36.6 C) (09/05 0547) Pulse Rate:  [76-107] 76 (09/05 0547) Resp:  [8-26] 15 (09/05 0547) BP: (125-180)/(53-98) 125/65 (09/05 0547) SpO2:  [83 %-100 %] 95 % (09/05 0547)  Intake/Output from previous day:  Intake/Output Summary (Last 24 hours) at 09/20/2023 0750 Last data filed at 09/20/2023 0600 Gross per 24 hour  Intake 4210.04 ml  Output 850 ml  Net 3360.04 ml     Intake/Output this shift: No intake/output data recorded.  Labs: Recent Labs    09/20/23 0323  HGB 9.9*   Recent Labs    09/20/23 0323  WBC 17.3*  RBC 3.15*  HCT 30.8*  PLT 180   Recent Labs    09/20/23 0323  NA 137  K 4.9  CL 104  CO2 23  BUN 14  CREATININE 0.75  GLUCOSE 145*  CALCIUM  8.8*   No results for input(s): LABPT, INR in the last 72 hours.  Exam: General - Patient is Alert and Oriented Extremity - Neurologically intact Sensation intact distally Intact pulses distally Dorsiflexion/Plantar flexion intact Dressing - dressing C/D/I Motor Function - intact, moving foot and toes well on exam.   Past Medical History:  Diagnosis Date   Arthritis    DJD (degenerative joint disease)    GERD (gastroesophageal reflux disease)    Heart murmur    had ECHO in 2020   Hypertension    Osteoarthritis    PONV (postoperative nausea and vomiting)    after colonoscopy    Assessment/Plan: 1 Day Post-Op Procedure(s) (LRB): ARTHROPLASTY, HIP, TOTAL, ANTERIOR APPROACH (Left) Principal  Problem:   H/O total hip arthroplasty, left Active Problems:   S/P total left hip arthroplasty  Estimated body mass index is 32.77 kg/m as calculated from the following:   Height as of this encounter: 5' 3 (1.6 m).   Weight as of this encounter: 83.9 kg. Advance diet Up with therapy D/C IV fluids  DVT Prophylaxis - Aspirin  Weight bearing as tolerated.  Hgb stable at 9.9 this AM  Plan is to go Home after hospital stay. Plan for discharge today after meeting goals with therapy. Follow up in the office in 2 weeks.   Rosina Calin, PA-C Orthopedic Surgery [phone] 09/20/2023, 7:50 AM

## 2023-09-20 NOTE — TOC Transition Note (Addendum)
 Transition of Care [facility]) - Discharge Note   Patient Details  Name: LORENE KLIMAS MRN: [id number] Date of Birth: [birth year]  [facility] CM/SW Contact:  [name], RN Phone Number: 09/20/2023, 10:15 AM   Clinical Narrative:   Met with patient at bedside to review dc therapy and home DME needs, patient confirmed  HEP, has RW, no home equipment needs. MOON completed. . No TOC needs.     Final next level of care: Home/Self Care     Patient Goals and CMS Choice Patient states their goals for this hospitalization and ongoing recovery are:: return home          Discharge Placement                       Discharge Plan and Services Additional resources added to the After Visit Summary for                                       Social Drivers of Health (SDOH) Interventions SDOH Screenings   Food Insecurity: No Food Insecurity (09/19/2023)  Housing: Low Risk  (09/19/2023)  Transportation Needs: No Transportation Needs (09/19/2023)  Utilities: Not At Risk (09/19/2023)  Social Connections: Moderately Isolated (09/19/2023)  Tobacco Use: Low Risk  (09/19/2023)     Readmission Risk Interventions     No data to display

## 2023-09-20 NOTE — Evaluation (Signed)
 Physical Therapy One Time Evaluation Patient Details Name: MALASHIA KAMAKA MRN: [id number] DOB: 06-Oct-1950 Today's Date: 09/20/2023  History of Present Illness  Pt is a 73 year old female s/p L THA on 09/20/23. PMHx: heart murmur, HTN, cervical fusion, OA, DJD  Clinical Impression  Patient evaluated by Physical Therapy with no further acute PT needs identified. All education has been completed and the patient has no further questions.  Pt ambulated in hallway, practiced safe stair technique and performed LE exercises.  Pt provided with stair and HEP handouts.  Daughter present and pt and daughter had no further questions.  Pt feels ready for d/c home today. PT is signing off. Thank you for this referral.          If plan is discharge home, recommend the following: Assist for transportation;Help with stairs or ramp for entrance   Can travel by private vehicle        Equipment Recommendations None recommended by PT  Recommendations for Other Services       Functional Status Assessment Patient has had a recent decline in their functional status and demonstrates the ability to make significant improvements in function in a reasonable and predictable amount of time.     Precautions / Restrictions Precautions Precautions: Fall Recall of Precautions/Restrictions: Intact Restrictions LLE Weight Bearing Per Provider Order: Weight bearing as tolerated      Mobility  Bed Mobility Overal bed mobility: Needs Assistance Bed Mobility: Supine to Sit     Supine to sit: Supervision, HOB elevated          Transfers Overall transfer level: Needs assistance Equipment used: Rolling walker (2 wheels) Transfers: Sit to/from Stand Sit to Stand: Supervision                Ambulation/Gait Ambulation/Gait assistance: Contact guard assist, Supervision Gait Distance (Feet): 120 Feet Assistive device: Rolling walker (2 wheels) Gait Pattern/deviations: Step-to pattern, Decreased  stance time - left, Antalgic       General Gait Details: verbal cues for sequence, RW positioning, step length, posture  Stairs Stairs: Yes Stairs assistance: Contact guard assist Stair Management: Step to pattern, Backwards, With walker Number of Stairs: 2 General stair comments: verbal cues for sequence and RW positioning; pt reports understanding; provided handout  Wheelchair Mobility     Tilt Bed    Modified Rankin (Stroke Patients Only)       Balance                                             Pertinent Vitals/Pain Pain Assessment Pain Assessment: 0-10 Pain Score: 4  Pain Location: left hip Pain Descriptors / Indicators: Sore, Aching Pain Intervention(s): Monitored during session, Repositioned    Home Living Family/patient expects to be discharged to:: Private residence Living Arrangements: Children;Spouse/significant other Available Help at Discharge: Family Type of Home: House Home Access: Stairs to enter Entrance Stairs-Rails: None Entrance Stairs-Number of Steps: 2   Home Layout: Able to live on main level with bedroom/bathroom Home Equipment: Agricultural consultant (2 wheels)      Prior Function Prior Level of Function : Independent/Modified Independent                     Extremity/Trunk Assessment        Lower Extremity Assessment Lower Extremity Assessment: LLE deficits/detail LLE Deficits / Details: anticipated post op  hip weakness observed, able to perform ankle pumps       Communication   Communication Communication: No apparent difficulties    Cognition Arousal: Alert Behavior During Therapy: WFL for tasks assessed/performed   PT - Cognitive impairments: No apparent impairments                         Following commands: Intact       Cueing Cueing Techniques: Verbal cues     General Comments      Exercises Total Joint Exercises Ankle Circles/Pumps: AROM, Both, 10 reps Quad Sets: AROM,  Both, 10 reps Heel Slides: AAROM, Left, 10 reps Hip ABduction/ADduction: AAROM, Left, 10 reps, AROM, Standing, Supine Long Arc Quad: AROM, Seated, Left, 10 reps Knee Flexion: AROM, Standing, Left, 10 reps Marching in Standing: AROM, Left, Standing, 10 reps Standing Hip Extension: AROM, Left, Standing, 10 reps   Assessment/Plan    PT Assessment Patient does not need any further PT services  PT Problem List         PT Treatment Interventions      PT Goals (Current goals can be found in the Care Plan section)  Acute Rehab PT Goals PT Goal Formulation: All assessment and education complete, DC therapy    Frequency       Co-evaluation               AM-PAC PT 6 Clicks Mobility  Outcome Measure Help needed turning from your back to your side while in a flat bed without using bedrails?: None Help needed moving from lying on your back to sitting on the side of a flat bed without using bedrails?: None Help needed moving to and from a bed to a chair (including a wheelchair)?: None Help needed standing up from a chair using your arms (e.g., wheelchair or bedside chair)?: None Help needed to walk in hospital room?: A Little Help needed climbing 3-5 steps with a railing? : A Little 6 Click Score: 22    End of Session Equipment Utilized During Treatment: Gait belt Activity Tolerance: Patient tolerated treatment well Patient left: in chair;with call bell/phone within reach;with family/visitor present Nurse Communication: Mobility status PT Visit Diagnosis: Difficulty in walking, not elsewhere classified (R26.2)    Time: 8882-8858 PT Time Calculation (min) (ACUTE ONLY): 24 min   Charges:   PT Evaluation $PT Eval Low Complexity: 1 Low PT Treatments $Therapeutic Exercise: 8-22 mins PT General Charges $$ ACUTE PT VISIT: 1 Visit        Tari PT, DPT Physical Therapist Acute Rehabilitation Services Office: [phone]   Kati L Payson 09/20/2023, 1:02 PM

## 2023-09-24 NOTE — Discharge Summary (Signed)
 Patient ID: Nichole Brewer MRN: [id number] DOB/AGE: 1950-04-07 73 y.o.  Admit date: 09/19/2023 Discharge date: 09/20/2023  Admission Diagnoses:  Left hip osteoarthritis  Discharge Diagnoses:  Principal Problem:   H/O total hip arthroplasty, left Active Problems:   S/P total left hip arthroplasty   Past Medical History:  Diagnosis Date   Arthritis    DJD (degenerative joint disease)    GERD (gastroesophageal reflux disease)    Heart murmur    had ECHO in 2020   Hypertension    Osteoarthritis    PONV (postoperative nausea and vomiting)    after colonoscopy    Surgeries: Procedure(s): ARTHROPLASTY, HIP, TOTAL, ANTERIOR APPROACH on 09/19/2023   Consultants:   Discharged Condition: Improved  Hospital Course: Nichole Brewer is an 73 y.o. female who was admitted 09/19/2023 for operative treatment ofH/O total hip arthroplasty, left. Patient has severe unremitting pain that affects sleep, daily activities, and work/hobbies. After pre-op clearance the patient was taken to the operating room on 09/19/2023 and underwent  Procedure(s): ARTHROPLASTY, HIP, TOTAL, ANTERIOR APPROACH.    Patient was given perioperative antibiotics:  Anti-infectives (From admission, onward)    Start     Dose/Rate Route Frequency Ordered Stop   09/19/23 1500  ceFAZolin  (ANCEF ) IVPB 2g/100 mL premix        2 g 200 mL/hr over 30 Minutes Intravenous Every 6 hours 09/19/23 1315 09/19/23 2154   09/19/23 0630  ceFAZolin  (ANCEF ) IVPB 2g/100 mL premix        2 g 200 mL/hr over 30 Minutes Intravenous On call to O.R. 09/19/23 9375 09/19/23 0859        Patient was given sequential compression devices, early ambulation, and chemoprophylaxis to prevent DVT. Patient worked with PT and was meeting their goals regarding safe ambulation and transfers.  Patient benefited maximally from hospital stay and there were no complications.    Recent vital signs: No data found.   Recent laboratory studies: No results for  input(s): WBC, HGB, HCT, PLT, NA, K, CL, CO2, BUN, CREATININE, GLUCOSE, INR, CALCIUM  in the last 72 hours.  Invalid input(s): PT, 2   Discharge Medications:   Allergies as of 09/20/2023   No Known Allergies      Medication List     STOP taking these medications    diclofenac 75 MG EC tablet Commonly known as: VOLTAREN   traMADol  50 MG tablet Commonly known as: ULTRAM        TAKE these medications    acetaminophen  500 MG tablet Commonly known as: TYLENOL  Take 1,000 mg by mouth every 6 (six) hours as needed for moderate pain (pain score 4-6).   aspirin  81 MG chewable tablet Chew 1 tablet (81 mg total) by mouth 2 (two) times daily for 28 days.   celecoxib  200 MG capsule Commonly known as: CELEBREX  Take 1 capsule (200 mg total) by mouth 2 (two) times daily.   COQ-10 PO Take 1 capsule by mouth every Monday, Wednesday, and Friday.   cyanocobalamin 1000 MCG tablet Commonly known as: VITAMIN B12 Take 1,000 mcg by mouth daily.   lidocaine  5 % Commonly known as: LIDODERM  Place 1 patch onto the skin daily as needed (Pain). Remove & Discard patch within 12 hours or as directed by MD   methocarbamol  500 MG tablet Commonly known as: ROBAXIN  Take 1 tablet (500 mg total) by mouth every 6 (six) hours as needed for muscle spasms.   oxyCODONE  5 MG immediate release tablet Commonly known as: Oxy IR/ROXICODONE  Take 1 tablet (  5 mg total) by mouth every 4 (four) hours as needed for severe pain (pain score 7-10).   polyethylene glycol 17 g packet Commonly known as: MIRALAX  / GLYCOLAX  Take 17 g by mouth 2 (two) times daily.   rosuvastatin  10 MG tablet Commonly known as: CRESTOR  Take 10 mg by mouth every Monday, Wednesday, and Friday.   senna 8.6 MG Tabs tablet Commonly known as: SENOKOT Take 2 tablets (17.2 mg total) by mouth at bedtime for 14 days.               Discharge Care Instructions  (From admission, onward)           Start      Ordered   09/20/23 0000  Change dressing       Comments: Maintain surgical dressing until follow up in the clinic. If the edges start to pull up, may reinforce with tape. If the dressing is no longer working, may remove and cover with gauze and tape, but must keep the area dry and clean.  Call with any questions or concerns.   09/20/23 0752            Diagnostic Studies: DG Pelvis Portable Result Date: 09/19/2023 CLINICAL DATA:  Status post total hip arthroplasty EXAM: PORTABLE PELVIS 1-2 VIEWS COMPARISON:  November 04, 2015 FINDINGS: Sequelae of interval left total hip arthroplasty with components well seated and articulating appropriately. No acute fractures identified. Unchanged right hip arthroplasty hardware. Soft tissue gas about left thigh, likely postsurgical. IMPRESSION: Sequelae of interval left total hip arthroplasty with no acute adverse features identified. Electronically Signed   By: Michaeline Blanch M.D.   On: 09/19/2023 12:22   DG HIP UNILAT WITH PELVIS 1V LEFT Result Date: 09/19/2023 CLINICAL DATA:  Elective surgery. EXAM: DG HIP (WITH OR WITHOUT PELVIS) 1V*L* COMPARISON:  None Available. FINDINGS: Fourteen fluoroscopic spot views of the pelvis and left hip obtained in the operating room. Sequential images during hip arthroplasty. Fluoroscopy time 15 seconds. Dose 1.79 mGy. Previous right hip arthroplasty. IMPRESSION: Intraoperative fluoroscopy during left hip arthroplasty. Electronically Signed   By: Andrea Gasman M.D.   On: 09/19/2023 10:29   DG C-Arm 1-60 Min-No Report Result Date: 09/19/2023 Fluoroscopy was utilized by the requesting physician.  No radiographic interpretation.   DG C-Arm 1-60 Min-No Report Result Date: 09/19/2023 Fluoroscopy was utilized by the requesting physician.  No radiographic interpretation.    Disposition: Discharge disposition: 01-Home or Self Care       Discharge Instructions     Call MD / Call 911   Complete by: As directed    If you  experience chest pain or shortness of breath, CALL 911 and be transported to the hospital emergency room.  If you develope a fever above 101 F, pus (white drainage) or increased drainage or redness at the wound, or calf pain, call your surgeon's office.   Change dressing   Complete by: As directed    Maintain surgical dressing until follow up in the clinic. If the edges start to pull up, may reinforce with tape. If the dressing is no longer working, may remove and cover with gauze and tape, but must keep the area dry and clean.  Call with any questions or concerns.   Constipation Prevention   Complete by: As directed    Drink plenty of fluids.  Prune juice may be helpful.  You may use a stool softener, such as Colace (over the counter) 100 mg twice a day.  Use MiraLax  (  over the counter) for constipation as needed.   Diet - low sodium heart healthy   Complete by: As directed    Increase activity slowly as tolerated   Complete by: As directed    Weight bearing as tolerated with assist device (walker, cane, etc) as directed, use it as long as suggested by your surgeon or therapist, typically at least 4-6 weeks.   Post-operative opioid taper instructions:   Complete by: As directed    POST-OPERATIVE OPIOID TAPER INSTRUCTIONS: It is important to wean off of your opioid medication as soon as possible. If you do not need pain medication after your surgery it is ok to stop day one. Opioids include: Codeine, Hydrocodone(Norco, Vicodin), Oxycodone (Percocet, oxycontin ) and hydromorphone  amongst others.  Long term and even short term use of opiods can cause: Increased pain response Dependence Constipation Depression Respiratory depression And more.  Withdrawal symptoms can include Flu like symptoms Nausea, vomiting And more Techniques to manage these symptoms Hydrate well Eat regular healthy meals Stay active Use relaxation techniques(deep breathing, meditating, yoga) Do Not substitute Alcohol  to help with tapering If you have been on opioids for less than two weeks and do not have pain than it is ok to stop all together.  Plan to wean off of opioids This plan should start within one week post op of your joint replacement. Maintain the same interval or time between taking each dose and first decrease the dose.  Cut the total daily intake of opioids by one tablet each day Next start to increase the time between doses. The last dose that should be eliminated is the evening dose.      TED hose   Complete by: As directed    Use stockings (TED hose) for 2 weeks on both leg(s).  You may remove them at night for sleeping.        Follow-up Information     Ernie Cough, MD. Schedule an appointment as soon as possible for a visit in 2 week(s).   Specialty: Orthopedic Surgery Contact information: [address] [place] [address] [phone]                  Signed: Rosina JONELLE Calin 09/24/2023, 9:07 AM
